# Patient Record
Sex: Female | Born: 1945
Health system: Southern US, Community
[De-identification: ages and names within clinical notes are randomized; demographics above are authoritative.]

## PROBLEM LIST (undated history)

## (undated) DIAGNOSIS — Z780 Asymptomatic menopausal state: Secondary | ICD-10-CM

## (undated) DIAGNOSIS — M8580 Other specified disorders of bone density and structure, unspecified site: Secondary | ICD-10-CM

## (undated) DIAGNOSIS — M19049 Primary osteoarthritis, unspecified hand: Secondary | ICD-10-CM

## (undated) HISTORY — PX: BREAST EXCISIONAL BIOPSY: SUR124

## (undated) HISTORY — DX: Primary osteoarthritis, unspecified hand: M19.049

## (undated) HISTORY — DX: Asymptomatic menopausal state: Z78.0

## (undated) HISTORY — PX: CATARACT EXTRACTION, BILATERAL: SHX1313

## (undated) HISTORY — DX: Other specified disorders of bone density and structure, unspecified site: M85.80

## (undated) HISTORY — PX: BREAST SURGERY: SHX581

## (undated) HISTORY — PX: TONSILLECTOMY: SUR1361

## (undated) HISTORY — PX: OTHER SURGICAL HISTORY: SHX169

---

## 1972-10-24 HISTORY — PX: BREAST EXCISIONAL BIOPSY: SUR124

## 1999-03-16 ENCOUNTER — Encounter: Payer: Self-pay | Admitting: Obstetrics & Gynecology

## 1999-03-16 ENCOUNTER — Ambulatory Visit (HOSPITAL_COMMUNITY): Admission: RE | Admit: 1999-03-16 | Discharge: 1999-03-16 | Payer: Self-pay | Admitting: Obstetrics & Gynecology

## 1999-04-20 ENCOUNTER — Other Ambulatory Visit: Admission: RE | Admit: 1999-04-20 | Discharge: 1999-04-20 | Payer: Self-pay | Admitting: Obstetrics and Gynecology

## 1999-04-21 ENCOUNTER — Encounter (INDEPENDENT_AMBULATORY_CARE_PROVIDER_SITE_OTHER): Payer: Self-pay | Admitting: Specialist

## 1999-04-21 ENCOUNTER — Other Ambulatory Visit: Admission: RE | Admit: 1999-04-21 | Discharge: 1999-04-21 | Payer: Self-pay | Admitting: Obstetrics and Gynecology

## 2000-04-10 ENCOUNTER — Encounter: Payer: Self-pay | Admitting: Obstetrics and Gynecology

## 2000-04-10 ENCOUNTER — Ambulatory Visit (HOSPITAL_COMMUNITY): Admission: RE | Admit: 2000-04-10 | Discharge: 2000-04-10 | Payer: Self-pay | Admitting: Obstetrics and Gynecology

## 2000-06-08 ENCOUNTER — Other Ambulatory Visit: Admission: RE | Admit: 2000-06-08 | Discharge: 2000-06-08 | Payer: Self-pay | Admitting: *Deleted

## 2001-04-17 ENCOUNTER — Ambulatory Visit (HOSPITAL_COMMUNITY): Admission: RE | Admit: 2001-04-17 | Discharge: 2001-04-17 | Payer: Self-pay | Admitting: Obstetrics and Gynecology

## 2001-04-17 ENCOUNTER — Encounter: Payer: Self-pay | Admitting: Obstetrics and Gynecology

## 2001-05-15 ENCOUNTER — Other Ambulatory Visit: Admission: RE | Admit: 2001-05-15 | Discharge: 2001-05-15 | Payer: Self-pay | Admitting: Obstetrics and Gynecology

## 2002-08-05 ENCOUNTER — Other Ambulatory Visit: Admission: RE | Admit: 2002-08-05 | Discharge: 2002-08-05 | Payer: Self-pay | Admitting: Obstetrics and Gynecology

## 2002-08-20 ENCOUNTER — Ambulatory Visit (HOSPITAL_COMMUNITY): Admission: RE | Admit: 2002-08-20 | Discharge: 2002-08-20 | Payer: Self-pay | Admitting: Obstetrics and Gynecology

## 2002-08-20 ENCOUNTER — Encounter: Payer: Self-pay | Admitting: Obstetrics and Gynecology

## 2003-09-02 ENCOUNTER — Encounter: Admission: RE | Admit: 2003-09-02 | Discharge: 2003-09-02 | Payer: Self-pay | Admitting: Obstetrics and Gynecology

## 2003-09-09 ENCOUNTER — Ambulatory Visit (HOSPITAL_COMMUNITY): Admission: RE | Admit: 2003-09-09 | Discharge: 2003-09-09 | Payer: Self-pay | Admitting: Obstetrics & Gynecology

## 2005-12-06 ENCOUNTER — Ambulatory Visit (HOSPITAL_COMMUNITY): Admission: RE | Admit: 2005-12-06 | Discharge: 2005-12-06 | Payer: Self-pay | Admitting: Obstetrics and Gynecology

## 2007-09-19 ENCOUNTER — Ambulatory Visit (HOSPITAL_COMMUNITY): Admission: RE | Admit: 2007-09-19 | Discharge: 2007-09-19 | Payer: Self-pay | Admitting: Family Medicine

## 2008-09-19 ENCOUNTER — Encounter: Admission: RE | Admit: 2008-09-19 | Discharge: 2008-09-19 | Payer: Self-pay | Admitting: Family Medicine

## 2009-09-29 ENCOUNTER — Encounter: Admission: RE | Admit: 2009-09-29 | Discharge: 2009-09-29 | Payer: Self-pay | Admitting: Family Medicine

## 2010-02-17 ENCOUNTER — Other Ambulatory Visit: Admission: RE | Admit: 2010-02-17 | Discharge: 2010-02-17 | Payer: Self-pay | Admitting: Family Medicine

## 2010-02-17 LAB — HM PAP SMEAR: HM Pap smear: NORMAL

## 2010-04-15 LAB — HM COLONOSCOPY

## 2010-10-26 ENCOUNTER — Encounter
Admission: RE | Admit: 2010-10-26 | Discharge: 2010-10-26 | Payer: Self-pay | Source: Home / Self Care | Attending: Family Medicine | Admitting: Family Medicine

## 2010-10-26 LAB — HM MAMMOGRAPHY: HM Mammogram: ABNORMAL

## 2010-11-02 ENCOUNTER — Encounter
Admission: RE | Admit: 2010-11-02 | Discharge: 2010-11-02 | Payer: Self-pay | Source: Home / Self Care | Attending: Family Medicine | Admitting: Family Medicine

## 2010-11-14 ENCOUNTER — Encounter: Payer: Self-pay | Admitting: Family Medicine

## 2011-03-11 NOTE — Group Therapy Note (Signed)
   NAME:  Sheryl Baker, Sheryl Baker NO.:  [id number]   MEDICAL RECORD NO.:  [id number]                   PATIENT TYPE:  OUT   LOCATION:  [facility]                           FACILITY:  [facility]   PHYSICIAN:  Elsie Lincoln, MD                   DATE OF BIRTH:  [birth year]   DATE OF SERVICE:  09/02/2003                                    CLINIC NOTE   CHIEF COMPLAINT:  The patient is a 65 year old G1 para 1-0-0-1, LMP 10 years  ago, female, who presents for a wellness visit.  The patient has no  complaints of vaginal bleeding, vaginal discharge, vaginal burning, pelvic  pain, or urinary symptoms.  The patient was recently getting care at  [facility] and we have her records.  She has a normal Pap smear and  normal mammogram done October 2003.   REVIEW OF SYMPTOMS:  No chest pain, shortness of breath, abdominal pain,  nausea, vomiting, diarrhea, or change in bladder habits.   PAST MEDICAL HISTORY:  Denies.   PAST SURGICAL HISTORY:  Left breast lump removed that was benign.   PAST GYNECOLOGICAL HISTORY:  No fibroids, no cysts, no sexually transmitted  diseases, no abnormal Pap smears.  The patient had one full term normal  vaginal delivery that she placed for adoption.   PHYSICAL EXAMINATION:  VITAL SIGNS:  Blood pressure 145/70, pulse 94.  GENERAL:  Well-nourished, well-developed, no apparent distress.  BREASTS:  No skin changes, no masses.  ABDOMEN:  Soft, nontender, nondistended.  PELVIC:  External genitalia:  Tanner V.  Vagina:  Atrophic, no discharge, no  blood.  Cervix:  Grossly normal, closed.  Uterus:  Small, nontender.  Adnexa:  No masses, nontender.  Rectovaginal:  No masses, guaiac done.   ASSESSMENT AND PLAN:  A 65 year old G1 para 1-0-0-1 with normal GYN exam.   1. Pap smear done today.  2. DEXA and mammogram ordered.  3. Increase calcium intake to 1500 mg a day with vitamin D.  The patient is     to continue weight-bearing exercises to help  improve bone health.  4. Return to clinic in one year or sooner if needed.                                               Elsie Lincoln, MD    KL/MEDQ  D:  09/02/2003  T:  09/02/2003  Job:  [id number]

## 2011-03-24 ENCOUNTER — Encounter: Payer: Self-pay | Admitting: *Deleted

## 2011-06-22 ENCOUNTER — Ambulatory Visit (INDEPENDENT_AMBULATORY_CARE_PROVIDER_SITE_OTHER): Payer: Medicare Other | Admitting: Family Medicine

## 2011-06-22 ENCOUNTER — Encounter: Payer: Self-pay | Admitting: Family Medicine

## 2011-06-22 VITALS — BP 138/70 | HR 64 | Ht 70.0 in | Wt 145.0 lb

## 2011-06-22 DIAGNOSIS — Z23 Encounter for immunization: Secondary | ICD-10-CM

## 2011-06-22 DIAGNOSIS — M8580 Other specified disorders of bone density and structure, unspecified site: Secondary | ICD-10-CM | POA: Insufficient documentation

## 2011-06-22 DIAGNOSIS — M899 Disorder of bone, unspecified: Secondary | ICD-10-CM

## 2011-06-22 DIAGNOSIS — Z131 Encounter for screening for diabetes mellitus: Secondary | ICD-10-CM

## 2011-06-22 DIAGNOSIS — Z13220 Encounter for screening for lipoid disorders: Secondary | ICD-10-CM

## 2011-06-22 DIAGNOSIS — R5383 Other fatigue: Secondary | ICD-10-CM

## 2011-06-22 DIAGNOSIS — Z0000 Encounter for general adult medical examination without abnormal findings: Secondary | ICD-10-CM

## 2011-06-22 DIAGNOSIS — R5381 Other malaise: Secondary | ICD-10-CM

## 2011-06-22 LAB — LIPID PANEL
Cholesterol: 210 mg/dL — ABNORMAL HIGH (ref 0–200)
HDL: 78 mg/dL (ref >39)
LDL Cholesterol: 120 mg/dL — ABNORMAL HIGH (ref 0–99)
Triglycerides: 62 mg/dL (ref <150)

## 2011-06-22 LAB — POCT URINALYSIS DIPSTICK
Bilirubin, UA: NEGATIVE
Glucose, UA: NEGATIVE
Nitrite, UA: NEGATIVE
Urobilinogen, UA: NEGATIVE

## 2011-06-22 LAB — VITAMIN D 25 HYDROXY (VIT D DEFICIENCY, FRACTURES): Vit D, 25-Hydroxy: 52 ng/mL (ref 30–89)

## 2011-06-22 NOTE — Patient Instructions (Addendum)
Consider either stopping aspirin or lowering dose to 81mg --you have to consider the potential risks of aspirin (ulcer, gastritis), which may outweight the potential benefit, especially given that you don't have significant risk factors for heart disease or stroke  HEALTH MAINTENANCE RECOMMENDATIONS:  It is recommended that you get at least 30 minutes of aerobic exercise at least 5 days/week (for weight loss, you may need as much as 60-90 minutes). This can be any activity that gets your heart rate up. This can be divided in 10-15 minute intervals if needed, but try and build up your endurance at least once a week.  Weight bearing exercise is also recommended twice weekly.  Eat a healthy diet with lots of vegetables, fruits and fiber.  "Colorful" foods have a lot of vitamins (ie green vegetables, tomatoes, red peppers, etc).  Limit sweet tea, regular sodas and alcoholic beverages, all of which has a lot of calories and sugar.  Up to 1 alcoholic drink daily may be beneficial for women (unless trying to lose weight, watch sugars).  Drink a lot of water.  Calcium recommendations are 1200-1500 mg daily (1500 mg for postmenopausal women or women without ovaries), and vitamin D 1000 IU daily.  This should be obtained from diet and/or supplements (vitamins), and calcium should not be taken all at once, but in divided doses.  Monthly self breast exams and yearly mammograms for women over the age of 71 is recommended.  Sunscreen of at least SPF 30 should be used on all sun-exposed parts of the skin when outside between the hours of 10 am and 4 pm (not just when at beach or pool, but even with exercise, golf, tennis, and yard work!)  Use a sunscreen that says "broad spectrum" so it covers both UVA and UVB rays, and make sure to reapply every 1-2 hours.  Remember to change the batteries in your smoke detectors when changing your clock times in the spring and fall.  Use your seat belt every time you are in a car,  and please drive safely and not be distracted with cell phones and texting while driving.  Consider Shingles vaccine (Zostavax)--recommended, but without Medicare Part D, I don't think your insurance covers it Periodically check your blood pressure elsewhere.  If blood pressure persistently >140 systolic, then please follow up here to discuss.  Watch the sodium in your diet (limit to <2000 mg daily)

## 2011-06-22 NOTE — Progress Notes (Signed)
Sheryl Baker is a 65 y.o. female who presents for a complete physical.  She has the following concerns: None  Immunization History  Administered Date(s) Administered  . Influenza Split 07/01/2009  . Influenza Whole 07/28/2010, 06/22/2011  . Pneumococcal Polysaccharide 02/17/2010  . Td 07/25/2005   Last Pap smear:01/2010, no h/o abnormals Last mammogram: 09/2010 Last colonoscopy: 03/2010 (polyps biopsied, not actually polyps, no adenomatous change Last DEXA: 02/2010 (mild osteopenia; T-1.3 at L fem neck)--done at [facility] Dentist: every 6 months Ophtho: goes yearly Exercise: yardwork, walks 2-3 times/week for 30-60 minutes  Past Medical History  Diagnosis Date  . Postmenopausal     Past Surgical History  Procedure Date  . Excision of bcc lower eyelid Dr.Christensen 02/2009  . Right throat nodule removed(benign)Dr.Mundy 2001  . Breast surgery left breast lump removed(benign)1974    History   Social History  . Marital Status: Married    Spouse Name: N/A    Number of Children: N/A  . Years of Education: N/A   Occupational History  . 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 Smoking status: Never Smoker   . Smokeless tobacco: Never Used  . Alcohol Use: Yes     1 glass of beer or wine 2 to 3 times a week.  . Drug Use: No  . Sexually Active: Not on file   Other Topics Concern  . Not on file   Social History Narrative   Lives with her husband; she is his caretaker since he has had 2 strokes    Family History  Problem Relation Age of Onset  . Dementia Mother   . Heart disease Father     valve replacement and bypass surgery in his 77's  . Cancer Father     renal cell carcinoma  . Alcohol abuse Sister   . Diabetes Sister   . Seizures Sister   . Pancreatitis Sister     Current outpatient prescriptions:aspirin 325 MG tablet, Take 325 mg by mouth daily.  , Disp: , Rfl: ;  Calcium Carbonate-Vitamin D (CALCIUM  600+D) 600-400 MG-UNIT per tablet, Take 1 tablet by mouth daily.  , Disp: , Rfl: ;  Glucosamine-Chondroit-Vit C-Mn (GLUCOSAMINE CHONDR 1500 COMPLX PO), Take 1 tablet by mouth daily.  , Disp: , Rfl: ;  Multiple Vitamins-Minerals (MULTIVITAMIN WITH MINERALS) tablet, Take 1 tablet by mouth daily.  , Disp: , Rfl:  Omega-3 Fatty Acids (FISH OIL) 1000 MG CAPS, Take 1 capsule by mouth daily.  , Disp: , Rfl: ;  vitamin C (ASCORBIC ACID) 500 MG tablet, Take 500 mg by mouth daily.  , Disp: , Rfl:   Allergies  Allergen Reactions  . Codeine Nausea And Vomiting   ROS:The patient denies anorexia, fever, weight changes, headaches,  vision changes, decreased hearing, ear pain, sore throat, breast concerns, chest pain, palpitations, dizziness, syncope, dyspnea on exertion, cough, swelling, nausea, vomiting, diarrhea, constipation, abdominal pain, melena, hematochezia, indigestion/heartburn, hematuria, incontinence, dysuria, irregular menstrual cycles, vaginal discharge, odor or itch, genital lesions, joint pains, numbness, tingling, weakness, tremor, suspicious skin lesions, depression, anxiety, abnormal bleeding/bruising, or enlarged lymph nodes.  PHYSICAL EXAM: BP 140/70  Pulse 64  Ht 5\' 10"  (1.778 m)  Wt 145 lb (65.772 kg)  BMI 20.81 kg/m2  General Appearance:    Alert, cooperative, no distress, appears stated age  Head:    Normocephalic, without obvious abnormality, atraumatic  Eyes:    PERRL, conjunctiva/corneas clear, EOM's intact, fundi  benign  Ears:    Normal TM's and external ear canals  Nose:   Nares normal, mucosa normal, no drainage or sinus   tenderness  Throat:   Lips, mucosa, and tongue normal; teeth and gums normal  Neck:   Supple, no lymphadenopathy;  thyroid:  no   enlargement/tenderness/nodules; no carotid   bruit or JVD  Back:    Spine nontender, no curvature, ROM normal, no CVA     tenderness  Lungs:     Clear to auscultation bilaterally without wheezes, rales or     ronchi;  respirations unlabored  Chest Wall:    No tenderness or deformity   Heart:    Regular rate and rhythm, S1 and S2 normal, no murmur, rub   or gallop  Breast Exam:    No tenderness, masses, or nipple discharge or inversion.      No axillary lymphadenopathy  Abdomen:     Soft, non-tender, nondistended, normoactive bowel sounds,    no masses, no hepatosplenomegaly  Genitalia:    Normal external genitalia without lesions.  BUS and vagina normal; no cervical motion tenderness. No abnormal vaginal discharge.  Uterus and adnexa not enlarged, nontender, no masses.  Pap not performed; only bimanual exam  Rectal:    Normal tone, no masses or tenderness; guaiac negative stool  Extremities:   No clubbing, cyanosis or edema  Pulses:   2+ and symmetric all extremities  Skin:   Skin color, texture, turgor normal, no rashes or lesions  Lymph nodes:   Cervical, supraclavicular, and axillary nodes normal  Neurologic:   CNII-XII intact, normal strength, sensation and gait; reflexes 2+ and symmetric throughout          Psych:   Normal mood, affect, hygiene and grooming.    ASSESSMENT/PLAN: 1. Routine general medical examination at a health care facility  Flu vaccine greater than or equal to 3yo preservative free IM, Visual acuity screening, POCT Urinalysis Dipstick  2. Need for Tdap vaccination  Tdap vaccine greater than or equal to 7yo IM  3. Osteopenia  Vitamin D 25 hydroxy  4. Screening for lipoid disorders  Lipid panel  5. Screening for diabetes mellitus  Glucose, random  6. Other malaise and fatigue  Vitamin D 25 hydroxy, TSH, Glucose, random   Discussed borderline BP--she has BP monitor at home, advised to periodically check BP, limit sodium intake.  F/u if persistently >135-140 systolic  Mild osteopenia 02/2010.  Discussed calcium, vitamin D, weight bearing exercise.  Doesn't necessarily need to repeat 02/2012, but within the next 2-3 years from now.  Discussed monthly self breast exams and yearly  mammograms after the age of 73; at least 30 minutes of aerobic activity at least 5 days/week; proper sunscreen use reviewed; healthy diet, including goals of calcium and vitamin D intake and alcohol recommendations (less than or equal to 1 drink/day) reviewed; regular seatbelt use; changing batteries in smoke detectors.  Immunization recommendations discussed--TdaP and flu shot given today. Recommended Zostavax--waiting due to cost.  Colonoscopy recommendations reviewed--UTD

## 2011-06-23 ENCOUNTER — Encounter: Payer: Self-pay | Admitting: Family Medicine

## 2011-08-01 ENCOUNTER — Telehealth: Payer: Self-pay | Admitting: *Deleted

## 2011-08-01 NOTE — Telephone Encounter (Signed)
Patient called and said she would like 2 rx's for Zostavax written for her and Al please. I will mail them to her when ready. Thanks.

## 2011-08-01 NOTE — Telephone Encounter (Signed)
Confirm that they've never received vaccine.  Rx's written

## 2011-08-01 NOTE — Telephone Encounter (Signed)
Spoke with patient, she and her husband have not ever received vaccine, mailed rx's to them. Rec they use [facility] as they are very good at sending documentation to Dr.Knapp, but if they chose to go elsewhere please ask pharmacy to send [place] info. Thanks.

## 2012-05-24 ENCOUNTER — Other Ambulatory Visit: Payer: Self-pay | Admitting: Family Medicine

## 2012-05-24 DIAGNOSIS — Z1231 Encounter for screening mammogram for malignant neoplasm of breast: Secondary | ICD-10-CM

## 2012-05-29 ENCOUNTER — Ambulatory Visit
Admission: RE | Admit: 2012-05-29 | Discharge: 2012-05-29 | Disposition: A | Discharge status: Home / Self Care | Payer: Medicare Other | Source: Ambulatory Visit | Attending: Family Medicine | Admitting: Family Medicine

## 2012-05-29 DIAGNOSIS — Z1231 Encounter for screening mammogram for malignant neoplasm of breast: Secondary | ICD-10-CM

## 2012-05-31 ENCOUNTER — Other Ambulatory Visit: Payer: Self-pay | Admitting: Family Medicine

## 2012-05-31 DIAGNOSIS — R928 Other abnormal and inconclusive findings on diagnostic imaging of breast: Secondary | ICD-10-CM

## 2012-06-01 ENCOUNTER — Ambulatory Visit
Admission: RE | Admit: 2012-06-01 | Discharge: 2012-06-01 | Disposition: A | Discharge status: Home / Self Care | Payer: Medicare Other | Source: Ambulatory Visit | Attending: Family Medicine | Admitting: Family Medicine

## 2012-06-01 DIAGNOSIS — R928 Other abnormal and inconclusive findings on diagnostic imaging of breast: Secondary | ICD-10-CM

## 2012-08-06 ENCOUNTER — Encounter: Payer: Self-pay | Admitting: Internal Medicine

## 2012-08-09 ENCOUNTER — Telehealth: Payer: Self-pay | Admitting: *Deleted

## 2012-08-09 NOTE — Telephone Encounter (Signed)
Pt notified and appt canceled

## 2012-08-09 NOTE — Telephone Encounter (Signed)
Coming in for labs for CPE tomorrow, need to know what to order. Thanks.

## 2012-08-09 NOTE — Telephone Encounter (Signed)
It doesn't appear that she needs any routine labs (all normal last year, not on meds, not overweight).  If she is having symptoms or concerns, we can discuss at her CPE and order then.

## 2012-08-10 ENCOUNTER — Other Ambulatory Visit: Payer: Medicare Other

## 2012-08-13 ENCOUNTER — Encounter: Payer: Self-pay | Admitting: Family Medicine

## 2012-08-13 ENCOUNTER — Ambulatory Visit (INDEPENDENT_AMBULATORY_CARE_PROVIDER_SITE_OTHER): Payer: Medicare Other | Admitting: Family Medicine

## 2012-08-13 VITALS — BP 160/90 | HR 72 | Ht 69.75 in | Wt 152.0 lb

## 2012-08-13 DIAGNOSIS — E7800 Pure hypercholesterolemia, unspecified: Secondary | ICD-10-CM

## 2012-08-13 DIAGNOSIS — Z0000 Encounter for general adult medical examination without abnormal findings: Secondary | ICD-10-CM

## 2012-08-13 LAB — POCT URINALYSIS DIPSTICK
Blood, UA: NEGATIVE
Glucose, UA: NEGATIVE
Leukocytes, UA: NEGATIVE
Nitrite, UA: NEGATIVE
Protein, UA: NEGATIVE
Urobilinogen, UA: NEGATIVE
pH, UA: 5

## 2012-08-13 LAB — LIPID PANEL
Cholesterol: 224 mg/dL — ABNORMAL HIGH (ref 0–200)
HDL: 63 mg/dL (ref >39)
VLDL: 16 mg/dL (ref 0–40)

## 2012-08-13 NOTE — Progress Notes (Signed)
Chief Complaint  Patient presents with  . Annual Exam    non fasting annual exam/ AWV, last pap 02/17/2010.    Donated blood last week, BP was 138/68.  Housecalls nurse (through insurance) checked BP within the last month or two (early September?) and was 148/88.  Has machine at home, but hasn't checked her BP since then.  Denies any headaches, chest pain.  She brings in a form to be filled out in order to get Reward for having AWV done.  This requires lipids to be done.  Last ate approximately 6 hours ago.  Chart reviewed, and no other labs needed.  Health Maintenance: Immunization History  Administered Date(s) Administered  . Influenza Split 07/01/2009, 07/04/2012  . Influenza Whole 07/28/2010, 06/22/2011  . Pneumococcal Polysaccharide 02/17/2010  . Td 07/25/2005  . Tdap 06/22/2011  . Zoster 08/26/2011   Last Pap smear: 01/2010, no h/o abnormals Last mammogram: 05/2012 Last colonoscopy: 03/2010 Last DEXA: DEXA 02/2010 (mild osteopenia: T-1.3 at L fem neck), done at [facility] Dentist: every 6 months Ophtho: yearly Exercise: yardwork daily, walks 2-3 times/week for 60 minutes.  Annual Wellness Visit: Other doctors involved in patient's care:  Ophtho: Dr. Charlotte Sanes: Dentist: Dr. Sharma Covert Depression and ADL screen--see questionnaire (scanned) both normal. End of Life Care: doesn't have healthcare power of attorney or living will (though she is familiar with them, her husband has them).  Past Medical History  Diagnosis Date  . Postmenopausal     Past Surgical History  Procedure Date  . Excision of basal cell cancer lower eyelid Dr.Christensen 02/2009  . Right throat nodule removed(benign)Dr.Mundy 2001  . Breast surgery left breast lump removed(benign)1974    History   Social History  . Marital Status: Married    Spouse Name: N/A    Number of Children: N/A  . Years of Education: N/A   Occupational History  . 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 Smoking status: Never Smoker   . Smokeless tobacco: Never Used  . Alcohol Use: Yes     1 glass of beer or wine 2 times a week.  . Drug Use: No  . Sexually Active: Not Currently   Other Topics Concern  . 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 2 cats.  Stepdaughter and son-in-law leave nearby    Family History  Problem Relation Age of Onset  . Dementia Mother   . Heart disease Father     valve replacement and bypass surgery in his 52's  . Cancer Father     renal cell carcinoma  . Alcohol abuse Sister   . Diabetes Sister   . Seizures Sister   . Pancreatitis Sister     Current outpatient prescriptions:Calcium Carbonate-Vitamin D (CALCIUM 600+D) 600-400 MG-UNIT per tablet, Take 1 tablet by mouth daily.  , Disp: , Rfl: ;  Multiple Vitamins-Minerals (MULTIVITAMIN WITH MINERALS) tablet, Take 1 tablet by mouth daily.  , Disp: , Rfl: ;  Omega-3 Fatty Acids (FISH OIL) 1000 MG CAPS, Take 1 capsule by mouth daily.  , Disp: , Rfl: ;  vitamin C (ASCORBIC ACID) 500 MG tablet, Take 500 mg by mouth daily.  , Disp: , Rfl:  aspirin 325 MG tablet, Take 325 mg by mouth daily.  , Disp: , Rfl: ;  Glucosamine-Chondroit-Vit C-Mn (GLUCOSAMINE CHONDR 1500 COMPLX PO), Take 1 tablet by mouth  daily.  , Disp: , Rfl:   Allergies  Allergen Reactions  . Codeine Nausea And Vomiting   ROS:  The patient denies anorexia, fever, weight changes, headaches,  vision changes, decreased hearing, ear pain, sore throat, breast concerns, chest pain, palpitations, dizziness, syncope, dyspnea on exertion, cough, swelling, nausea, vomiting, diarrhea, constipation, abdominal pain, melena, hematochezia, indigestion/heartburn, hematuria, incontinence, dysuria, vaginal bleeding/spotting, discharge, odor or itch, genital lesions, joint pains, numbness, tingling, weakness, tremor, suspicious skin lesions, depression, anxiety, abnormal  bleeding/bruising, or enlarged lymph nodes.  PHYSICAL EXAM: BP 160/90  Pulse 72  Ht 5' 9.75" (1.772 m)  Wt 152 lb (68.947 kg)  BMI 21.97 kg/m2 Repeat BP 152/82 General Appearance:    Alert, cooperative, no distress, appears stated age  Head:    Normocephalic, without obvious abnormality, atraumatic  Eyes:    PERRL, conjunctiva/corneas clear, EOM's intact, fundi    benign  Ears:    Normal TM's and external ear canals  Nose:   Nares normal, mucosa normal, no drainage or sinus   tenderness  Throat:   Lips, mucosa, and tongue normal; teeth and gums normal  Neck:   Supple, no lymphadenopathy;  thyroid:  no   enlargement/tenderness/nodules; no carotid   bruit or JVD  Back:    Spine nontender, no curvature, ROM normal, no CVA     tenderness  Lungs:     Clear to auscultation bilaterally without wheezes, rales or     ronchi; respirations unlabored  Chest Wall:    No tenderness or deformity   Heart:    Regular rate and rhythm, S1 and S2 normal, no murmur, rub   or gallop  Breast Exam:    No tenderness, masses, or nipple discharge or inversion.      No axillary lymphadenopathy  Abdomen:     Soft, non-tender, nondistended, normoactive bowel sounds,    no masses, no hepatosplenomegaly  Genitalia:    Normal external genitalia without lesions.  BUS and vagina normal; bimanual exam normal--uterus and adnexa not enlarged, nontender, no masses.  Pap not performed  Rectal:    Normal tone, no masses or tenderness; guaiac negative stool  Extremities:   No clubbing, cyanosis or edema  Pulses:   2+ and symmetric all extremities  Skin:   Skin color, texture, turgor normal, no rashes or lesions  Lymph nodes:   Cervical, supraclavicular, and axillary nodes normal  Neurologic:   CNII-XII intact, normal strength, sensation and gait; reflexes 2+ and symmetric throughout          Psych:   Normal mood, affect, hygiene and grooming.    Normal urine dip.  ASSESSMENT/PLAN:  1. Routine general medical  examination at a health care facility    2. Medicare annual wellness visit, subsequent  POCT Urinalysis Dipstick, Visual acuity screening  3. Pure hypercholesterolemia  Lipid panel   Needs lipids done for patient to get AWV Reward (required for form).  Mild osteopenia 02/2010. Discussed calcium, vitamin D, weight bearing exercise. Didn't necessarily need to repeat in 02/2012, but within the next 1-3 years from now. Reviewed treatment with calcium, Vitamin D and weight-bearing exercise.  Discussed monthly self breast exams and yearly mammograms; at least 30 minutes of aerobic activity at least 5 days/week; proper sunscreen use reviewed; healthy diet, including goals of calcium and vitamin D intake and alcohol recommendations (less than or equal to 1 drink/day) reviewed; regular seatbelt use; changing batteries in smoke detectors.  Immunization recommendations discussed, UTD.  Colonoscopy recommendations reviewed, UTD  Reviewed  end of life issues--discussed and filled out MOST form.  Given information and encouraged her to get healthcare power of attorney and living will.

## 2012-08-13 NOTE — Patient Instructions (Signed)

## 2013-08-29 ENCOUNTER — Other Ambulatory Visit: Payer: Self-pay

## 2014-04-07 ENCOUNTER — Telehealth: Payer: Self-pay | Admitting: *Deleted

## 2014-04-07 NOTE — Telephone Encounter (Signed)
Spoke with patient and gave her 2 numbers one for Dr.Chan Melford Aase [phone] and Dr.Michelle Zigmund Daniel [phone] both taking new medicare patients.

## 2014-04-07 NOTE — Telephone Encounter (Signed)
Gwinda Passe called to inquire for a friend. 68 year old that lives at [place] that no longer desires to see Dr.Greene to monitor her coumadin. She Medicare and wanted to see Dr.Knapp, or Dr.Lalonde. I know that neither of you are taking new patient-any recommedations?

## 2014-04-07 NOTE — Telephone Encounter (Signed)
None of [place] at this practice are taking new Medi-Medi pts.  Front desk can tell her who is.  Depending on reason for her being on coumadin, sometimes a specialist can do (ie coumadin clinic through cardiologist office, etc)

## 2014-07-18 ENCOUNTER — Encounter: Payer: Self-pay | Admitting: Internal Medicine

## 2015-04-15 ENCOUNTER — Other Ambulatory Visit (HOSPITAL_COMMUNITY)
Admission: RE | Admit: 2015-04-15 | Discharge: 2015-04-15 | Disposition: A | Discharge status: Home / Self Care | Payer: Medicare Other | Source: Ambulatory Visit | Attending: Family Medicine | Admitting: Family Medicine

## 2015-04-15 ENCOUNTER — Encounter: Payer: Self-pay | Admitting: Family Medicine

## 2015-04-15 ENCOUNTER — Ambulatory Visit (INDEPENDENT_AMBULATORY_CARE_PROVIDER_SITE_OTHER): Payer: Medicare Other | Admitting: Family Medicine

## 2015-04-15 VITALS — BP 130/80 | HR 84 | Ht 70.0 in | Wt 149.4 lb

## 2015-04-15 DIAGNOSIS — Z23 Encounter for immunization: Secondary | ICD-10-CM

## 2015-04-15 DIAGNOSIS — Z0000 Encounter for general adult medical examination without abnormal findings: Secondary | ICD-10-CM | POA: Diagnosis not present

## 2015-04-15 DIAGNOSIS — R5383 Other fatigue: Secondary | ICD-10-CM | POA: Diagnosis not present

## 2015-04-15 DIAGNOSIS — Z01419 Encounter for gynecological examination (general) (routine) without abnormal findings: Secondary | ICD-10-CM

## 2015-04-15 DIAGNOSIS — E7800 Pure hypercholesterolemia, unspecified: Secondary | ICD-10-CM

## 2015-04-15 DIAGNOSIS — Z124 Encounter for screening for malignant neoplasm of cervix: Secondary | ICD-10-CM | POA: Diagnosis not present

## 2015-04-15 DIAGNOSIS — M8580 Other specified disorders of bone density and structure, unspecified site: Secondary | ICD-10-CM | POA: Diagnosis not present

## 2015-04-15 DIAGNOSIS — Z1151 Encounter for screening for human papillomavirus (HPV): Secondary | ICD-10-CM | POA: Diagnosis not present

## 2015-04-15 LAB — COMPREHENSIVE METABOLIC PANEL
ALBUMIN: 4.2 g/dL (ref 3.5–5.2)
ALK PHOS: 75 U/L (ref 39–117)
ALT: 12 U/L (ref 0–35)
AST: 19 U/L (ref 0–37)
BUN: 12 mg/dL (ref 6–23)
CO2: 25 mEq/L (ref 19–32)
Calcium: 9.5 mg/dL (ref 8.4–10.5)
Chloride: 106 mEq/L (ref 96–112)
Creat: 0.61 mg/dL (ref 0.50–1.10)
Glucose, Bld: 99 mg/dL (ref 70–99)
Potassium: 4.5 mEq/L (ref 3.5–5.3)
Sodium: 140 mEq/L (ref 135–145)
Total Bilirubin: 0.6 mg/dL (ref 0.2–1.2)
Total Protein: 6.6 g/dL (ref 6.0–8.3)

## 2015-04-15 LAB — CBC WITH DIFFERENTIAL/PLATELET
BASOS ABS: 0 10*3/uL (ref 0.0–0.1)
BASOS PCT: 0 % (ref 0–1)
Eosinophils Absolute: 0 10*3/uL (ref 0.0–0.7)
Eosinophils Relative: 0 % (ref 0–5)
HCT: 36.2 % (ref 36.0–46.0)
Hemoglobin: 11.9 g/dL — ABNORMAL LOW (ref 12.0–15.0)
LYMPHS ABS: 2 10*3/uL (ref 0.7–4.0)
LYMPHS PCT: 38 % (ref 12–46)
MCH: 29.4 pg (ref 26.0–34.0)
MCHC: 32.9 g/dL (ref 30.0–36.0)
MCV: 89.4 fL (ref 78.0–100.0)
MPV: 10.5 fL (ref 8.6–12.4)
Monocytes Absolute: 0.4 10*3/uL (ref 0.1–1.0)
Monocytes Relative: 8 % (ref 3–12)
NEUTROS PCT: 54 % (ref 43–77)
Neutro Abs: 2.9 10*3/uL (ref 1.7–7.7)
PLATELETS: 251 10*3/uL (ref 150–400)
RBC: 4.05 MIL/uL (ref 3.87–5.11)
RDW: 15.3 % (ref 11.5–15.5)
WBC: 5.3 10*3/uL (ref 4.0–10.5)

## 2015-04-15 LAB — LIPID PANEL
CHOL/HDL RATIO: 2.7 ratio
Cholesterol: 188 mg/dL (ref 0–200)
HDL: 69 mg/dL (ref >=46)
LDL Cholesterol: 102 mg/dL — ABNORMAL HIGH (ref 0–99)
Triglycerides: 83 mg/dL (ref <150)
VLDL: 17 mg/dL (ref 0–40)

## 2015-04-15 LAB — TSH: TSH: 2.431 u[IU]/mL (ref 0.350–4.500)

## 2015-04-15 LAB — POCT URINALYSIS DIPSTICK
Bilirubin, UA: NEGATIVE
GLUCOSE UA: NEGATIVE
Ketones, UA: NEGATIVE
Leukocytes, UA: NEGATIVE
NITRITE UA: NEGATIVE
Protein, UA: NEGATIVE
RBC UA: NEGATIVE
Spec Grav, UA: >=1.03
UROBILINOGEN UA: NEGATIVE
pH, UA: 6

## 2015-04-15 NOTE — Patient Instructions (Addendum)
  HEALTH MAINTENANCE RECOMMENDATIONS:  It is recommended that you get at least 30 minutes of aerobic exercise at least 5 days/week (for weight loss, you may need as much as 60-90 minutes). This can be any activity that gets your heart rate up. This can be divided in 10-15 minute intervals if needed, but try and build up your endurance at least once a week.  Weight bearing exercise is also recommended twice weekly.  Eat a healthy diet with lots of vegetables, fruits and fiber.  "Colorful" foods have a lot of vitamins (ie green vegetables, tomatoes, red peppers, etc).  Limit sweet tea, regular sodas and alcoholic beverages, all of which has a lot of calories and sugar.  Up to 1 alcoholic drink daily may be beneficial for women (unless trying to lose weight, watch sugars).  Drink a lot of water.  Calcium recommendations are 1200-1500 mg daily (1500 mg for postmenopausal women or women without ovaries), and vitamin D 1000 IU daily.  This should be obtained from diet and/or supplements (vitamins), and calcium should not be taken all at once, but in divided doses.  Monthly self breast exams and yearly mammograms for women over the age of 19 is recommended.  Sunscreen of at least SPF 30 should be used on all sun-exposed parts of the skin when outside between the hours of 10 am and 4 pm (not just when at beach or pool, but even with exercise, golf, tennis, and yard work!)  Use a sunscreen that says "broad spectrum" so it covers both UVA and UVB rays, and make sure to reapply every 1-2 hours.  Remember to change the batteries in your smoke detectors when changing your clock times in the spring and fall.  Use your seat belt every time you are in a car, and please drive safely and not be distracted with cell phones and texting while driving.   Ms. Bertz , Thank you for taking time to come for your Medicare Wellness Visit. I appreciate your ongoing commitment to your health goals. Please review the  following plan we discussed and let me know if I can assist you in the future.   These are the goals we discussed: Goals    None      This is a list of the screening recommended for you and due dates:  Health Maintenance  Topic Date Due  . Pneumonia vaccines (2 of 2 - PCV13) 02/18/2011  . Mammogram  06/01/2014  . Flu Shot  05/25/2015  . Colon Cancer Screening  04/15/2020  . Tetanus Vaccine  06/21/2021  . DEXA scan (bone density measurement)  Completed  . Shingles Vaccine  Completed   Pneumonia vaccine given today--you are now up to date.  Consider following up with your eye doctor--vision was quite poor today. Schedule your mammogram. Schedule your Bone Density test (they will likely call you, but order is in the computer so you can do this when you call to schedule your mammogram).

## 2015-04-15 NOTE — Progress Notes (Signed)
Chief Complaint  Patient presents with  . Annual Exam    fasting annual exam with pap. No concerns.   Sheryl Baker is a 69 y.o. female who presents for a complete physical and annual wellness visit.  She has no specific complaints.   Immunization History  Administered Date(s) Administered  . Influenza Split 07/01/2009, 07/04/2012, 07/05/2013  . Influenza Whole 07/28/2010, 06/22/2011  . Influenza, High Dose Seasonal PF 07/16/2014  . Pneumococcal Polysaccharide-23 02/17/2010  . Td 07/25/2005  . Tdap 06/22/2011  . Zoster 08/26/2011   Last Pap smear: 01/2010, no h/o abnormals Last mammogram: 05/2012 Last colonoscopy: 03/2010 Last DEXA: DEXA 02/2010 (mild osteopenia: T-1.3 at L fem neck), done at [facility]: every 6 months Ophtho: yearly Exercise: yardwork daily (mowing, moving mulch), walks 2-3 times/week for 60 minutes.  Annual Wellness Visit: Other doctors involved in patient's care:  Ophtho: Dr. Ellie Lunch Dentist: Dr. Mariea Clonts  GI: Dr. Oletta Lamas Derm: Dr. Teressa Senter  Depression and ADL screens in epic; completely normal. Of note, she came home to find one of her cats dead yesterday--a little teary in discussing this with nurse today.  End of Life Care: She now has healthcare power of attorney and living will--she will bring [place] copies.  She donates blood regularly, last time was about 10 days ago. Recalls Hg was 13.5.  Past Medical History  Diagnosis Date  . Postmenopausal   . Osteopenia     Past Surgical History  Procedure Laterality Date  . Excision of basal cell cancer  lower eyelid Dr.Christensen 02/2009  . Right throat nodule  removed(benign)Dr.Mundy 2001  . Breast surgery  left breast lump removed(benign)1974  . Tonsillectomy  age 56 or 52    History   Social History  . Marital Status: Married    Spouse Name: N/A  . Number of Children: N/A  . Years of Education: N/A   Occupational History  . 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 Smoking status: Never Smoker   . Smokeless tobacco: Never Used  . Alcohol Use: 0.0 oz/week    0 Standard drinks or equivalent per week     Comment: 1 glass of beer or wine 2 times a week or less  . Drug Use: No  . Sexual Activity: Not Currently   Other Topics Concern  . 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 1 cat.  Stepdaughter and son-in-law leave nearby    Family History  Problem Relation Age of Onset  . Dementia Mother   . Heart disease Father     valve replacement and bypass surgery in his 13's  . Cancer Father     renal cell carcinoma  . Alcohol abuse Sister   . Diabetes Sister   . Seizures Sister   . Pancreatitis Sister   . Dementia Sister 58    Outpatient Encounter Prescriptions as of 04/15/2015  Medication Sig Note  . Calcium Carbonate-Vitamin D (CALCIUM 600+D) 600-400 MG-UNIT per tablet Take 1 tablet by mouth daily.     . ferrous sulfate 325 (65 FE) MG tablet Take 325 mg by mouth daily with breakfast. 04/15/2015: Takes occasionally, usually prior to donating blood  . Glucosamine-Chondroit-Vit C-Mn (GLUCOSAMINE CHONDR 1500 COMPLX PO) Take 1 tablet by mouth daily.     . Multiple Vitamins-Minerals (MULTIVITAMIN WITH MINERALS) tablet Take 1 tablet by mouth daily.     Marland Kitchen  Omega-3 Fatty Acids (FISH OIL) 1000 MG CAPS Take 1 capsule by mouth daily.   08/13/2012: occasionally  . vitamin C (ASCORBIC ACID) 500 MG tablet Take 500 mg by mouth daily.     . [DISCONTINUED] aspirin 325 MG tablet Take 325 mg by mouth daily.      No facility-administered encounter medications on file as of 04/15/2015.    Allergies  Allergen Reactions  . Codeine Nausea And Vomiting      ROS: The patient denies anorexia, fever, weight changes, headaches, vision changes, decreased hearing, ear pain, sore throat, breast concerns, chest pain, palpitations, dizziness, syncope, dyspnea on  exertion, cough, swelling, nausea, vomiting, diarrhea, constipation, abdominal pain, melena, hematochezia, indigestion/heartburn, hematuria, incontinence, dysuria, vaginal bleeding/spotting, discharge, odor or itch, genital lesions, joint pains, numbness, tingling, weakness, tremor, suspicious skin lesions, depression, anxiety, abnormal bleeding/bruising, or enlarged lymph nodes.   PHYSICAL EXAM:  BP 130/80 mmHg  Pulse 84  Ht 5' 10" (1.778 m)  Wt 149 lb 6.4 oz (67.767 kg)  BMI 21.44 kg/m2  Vision with contacts was 20/70 together, 20/100 on right, 20/200 on the left (monovision contact)--last ophtho reportedly 09/2014.  General Appearance:   Alert, cooperative, no distress, appears stated age  Head:   Normocephalic, without obvious abnormality, atraumatic  Eyes:   PERRL, conjunctiva/corneas clear, EOM's intact, fundi not well visualized; cataract on the left  Ears:   Normal TM's and external ear canals  Nose:  Nares normal, mucosa normal, no drainage or sinus tenderness  Throat:  Lips, mucosa, and tongue normal; teeth and gums normal  Neck:  Supple, no lymphadenopathy; thyroid: no enlargement/tenderness/nodules; no carotid  bruit or JVD  Back:  Spine nontender, no curvature, ROM normal, no CVA tenderness  Lungs:   Clear to auscultation bilaterally without wheezes, rales or ronchi; respirations unlabored  Chest Wall:   No tenderness or deformity  Heart:   Regular rate and rhythm, S1 and S2 normal, no murmur, rub  or gallop  Breast Exam:   No tenderness, masses, or nipple discharge or inversion. No axillary lymphadenopathy  Abdomen:   Soft, non-tender, nondistended, normoactive bowel sounds,   no masses, no hepatosplenomegaly  Genitalia:   Normal external genitalia without lesions. BUS and vagina normal; cervix is normal without lesions. No cervical motion tenderness; bimanual exam normal--uterus and adnexa not enlarged,  nontender, no masses. Pap performed  Rectal:   Normal tone, no masses or tenderness; guaiac negative stool  Extremities:  No clubbing, cyanosis or edema  Pulses:  2+ and symmetric all extremities  Skin:  Skin color, texture, turgor normal, no rashes or lesions  Lymph nodes:  Cervical, supraclavicular, and axillary nodes normal  Neurologic:  CNII-XII intact, normal strength, sensation and gait; reflexes 2+ and symmetric throughout   Psych: Normal mood, affect, hygiene and grooming        ASSESSMENT/PLAN:  Annual physical exam - Plan: Visual acuity screening, POCT Urinalysis Dipstick, Lipid panel, Comprehensive metabolic panel, CBC with Differential/Platelet, TSH, Cytology - PAP [place]  Osteopenia - Plan: DG Bone Density  Medicare annual wellness visit, subsequent  Immunization due - Plan: Pneumococcal conjugate vaccine 13-valent  Encounter for routine gynecological examination - Plan: Cytology - PAP   Other fatigue - Plan: Comprehensive metabolic panel, CBC with Differential/Platelet, TSH  Pure hypercholesterolemia - Plan: Lipid panel   C-met, CBC, TSH, lipid. Only recheck vitamin D if significant decline in DEXA (was normal in 2012).   Mild osteopenia 02/2010. Discussed calcium, vitamin D, weight bearing exercise.  Repeat  DEXA, ordered.  Discussed monthly self breast exams and yearly mammograms; at least 30 minutes of aerobic activity at least 5 days/week; proper sunscreen use reviewed; healthy diet, including goals of calcium and vitamin D intake and alcohol recommendations (less than or equal to 1 drink/day) reviewed; regular seatbelt use; changing batteries in smoke detectors. Immunization recommendations discussed, Prevnar-13 today, continue yearly high dose flu shots. Colonoscopy recommendations reviewed, UTD.  Reviewed end of life issues-- MOST form updated and reviewed. Patient to get [place] a copy of her  healthcare power of attorney and living will.  F/u with ophtho re: poor vision.   Medicare Attestation I have personally reviewed: The patient's medical and social history Their use of alcohol, tobacco or illicit drugs Their current medications and supplements The patient's functional ability including ADLs,fall risks, home safety risks, cognitive, and hearing and visual impairment Diet and physical activities Evidence for depression or mood disorders  The patient's weight, height, BMI, and visual acuity have been recorded in the chart.  I have made referrals, counseling, and provided education to the patient based on review of the above and I have provided the patient with a written personalized care plan for preventive services.     KNAPP,EVE A, MD   04/15/2015

## 2015-04-16 ENCOUNTER — Encounter: Payer: Self-pay | Admitting: Family Medicine

## 2015-04-16 ENCOUNTER — Other Ambulatory Visit: Payer: Self-pay | Admitting: Family Medicine

## 2015-04-16 DIAGNOSIS — Z1231 Encounter for screening mammogram for malignant neoplasm of breast: Secondary | ICD-10-CM

## 2015-04-16 DIAGNOSIS — M8580 Other specified disorders of bone density and structure, unspecified site: Secondary | ICD-10-CM

## 2015-04-17 LAB — CYTOLOGY - PAP

## 2015-05-20 ENCOUNTER — Ambulatory Visit
Admission: RE | Admit: 2015-05-20 | Discharge: 2015-05-20 | Disposition: A | Discharge status: Home / Self Care | Payer: Medicare Other | Source: Ambulatory Visit | Attending: Family Medicine | Admitting: Family Medicine

## 2015-05-20 DIAGNOSIS — Z780 Asymptomatic menopausal state: Secondary | ICD-10-CM | POA: Diagnosis not present

## 2015-05-20 DIAGNOSIS — Z1231 Encounter for screening mammogram for malignant neoplasm of breast: Secondary | ICD-10-CM

## 2015-05-20 DIAGNOSIS — M8580 Other specified disorders of bone density and structure, unspecified site: Secondary | ICD-10-CM

## 2015-05-20 DIAGNOSIS — Z13820 Encounter for screening for osteoporosis: Secondary | ICD-10-CM | POA: Diagnosis not present

## 2015-05-20 LAB — HM DEXA SCAN: HM DEXA SCAN: NORMAL

## 2015-05-20 LAB — HM MAMMOGRAPHY

## 2015-05-26 ENCOUNTER — Encounter: Payer: Self-pay | Admitting: *Deleted

## 2015-05-27 ENCOUNTER — Encounter: Payer: Self-pay | Admitting: Internal Medicine

## 2015-10-25 HISTORY — PX: VARICOSE VEIN SURGERY: SHX832

## 2015-11-10 IMAGING — MG MM SCREENING BREAST TOMO BILATERAL
8 series · 9 of 24 positions shown · non-contrast
Comparison: Previous exam(s).

CLINICAL DATA: Screening.

EXAM:
DIGITAL SCREENING BILATERAL MAMMOGRAM WITH 3D TOMO WITH CAD

[R CC]
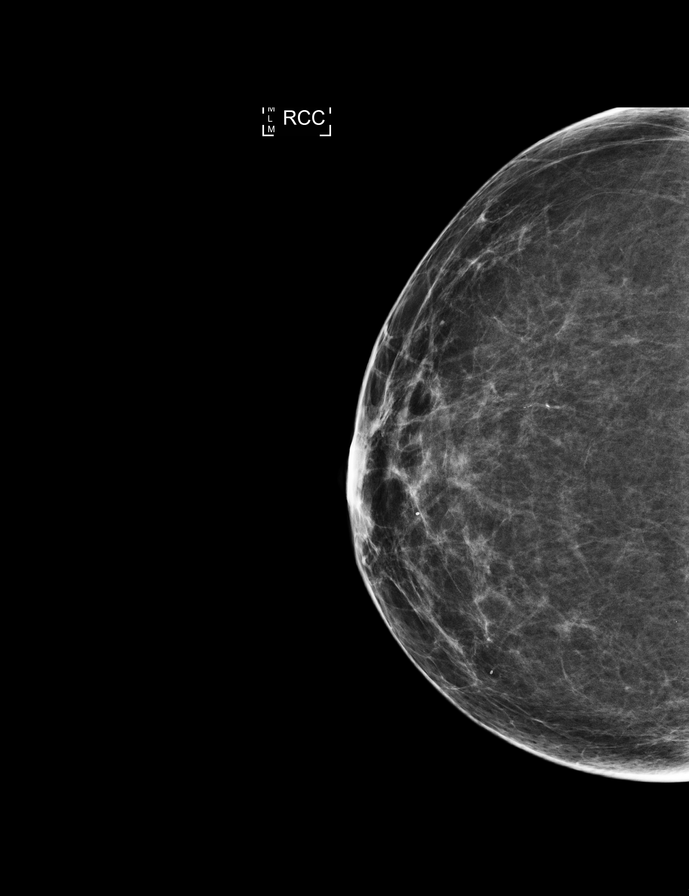

[L MLO]
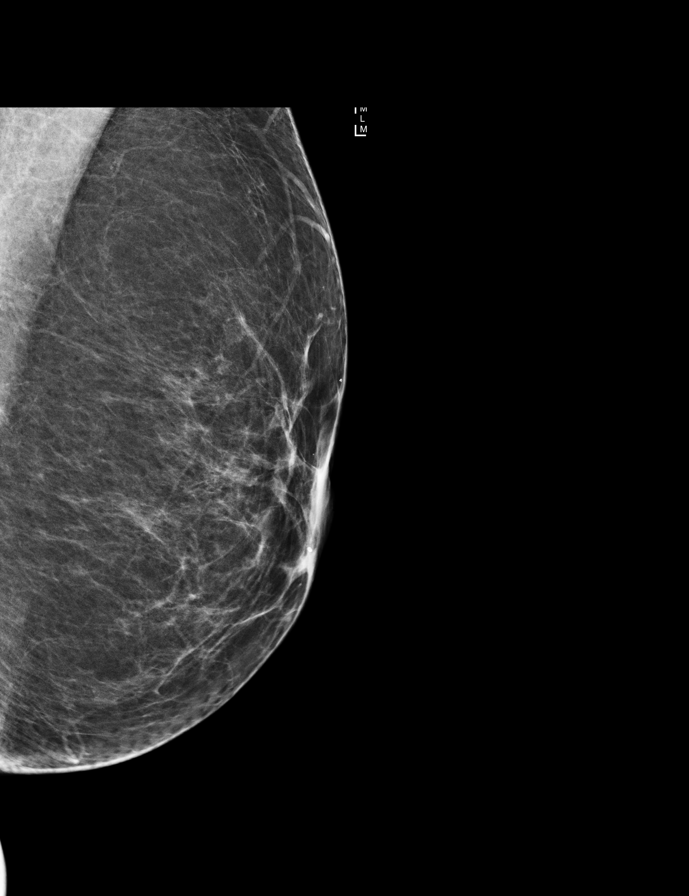

[R MLO]
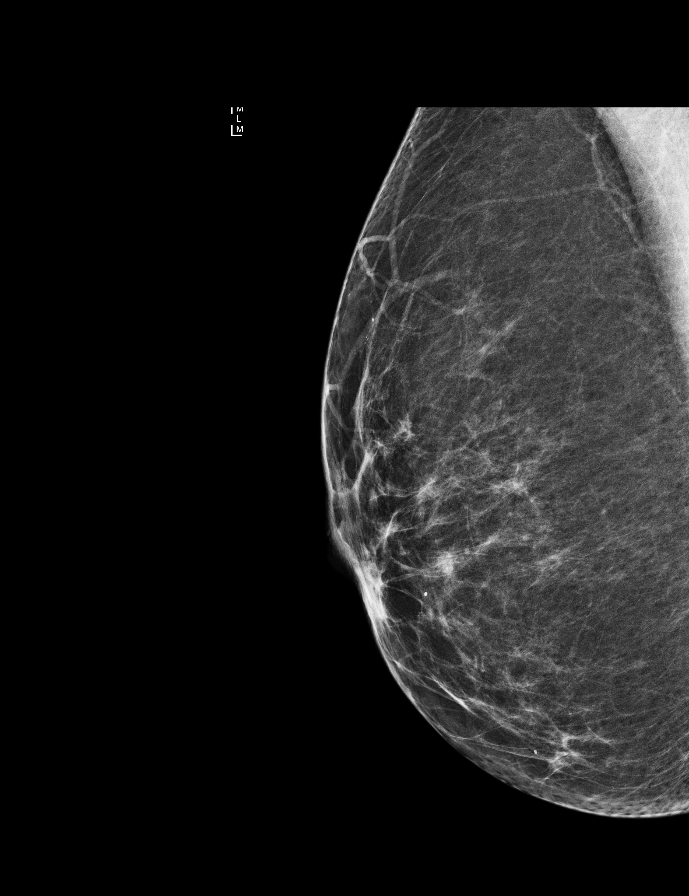

[L CC]
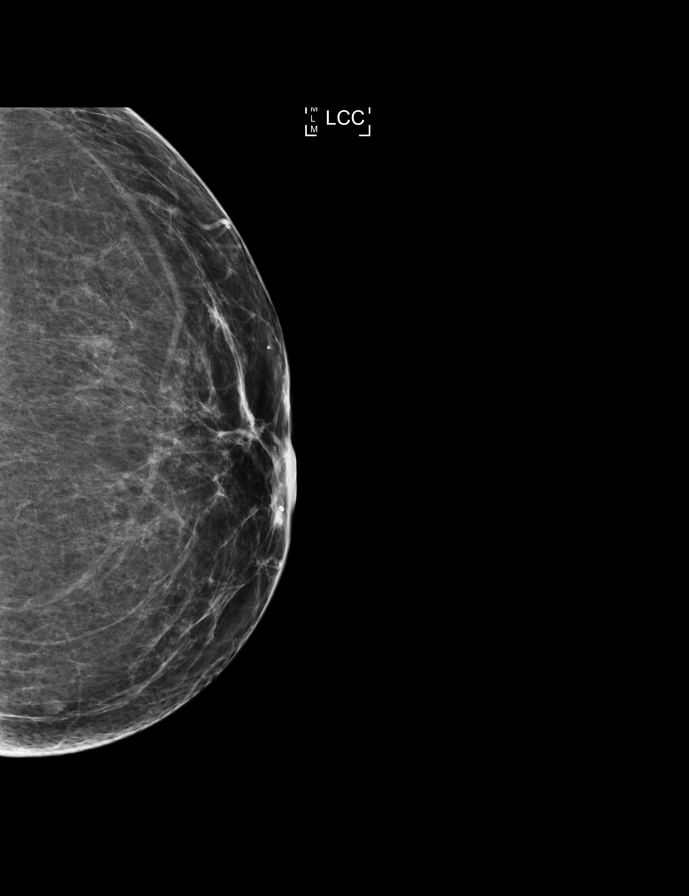

[R MLO tomo · 2 of 63 frames shown]
[frame 21/63]
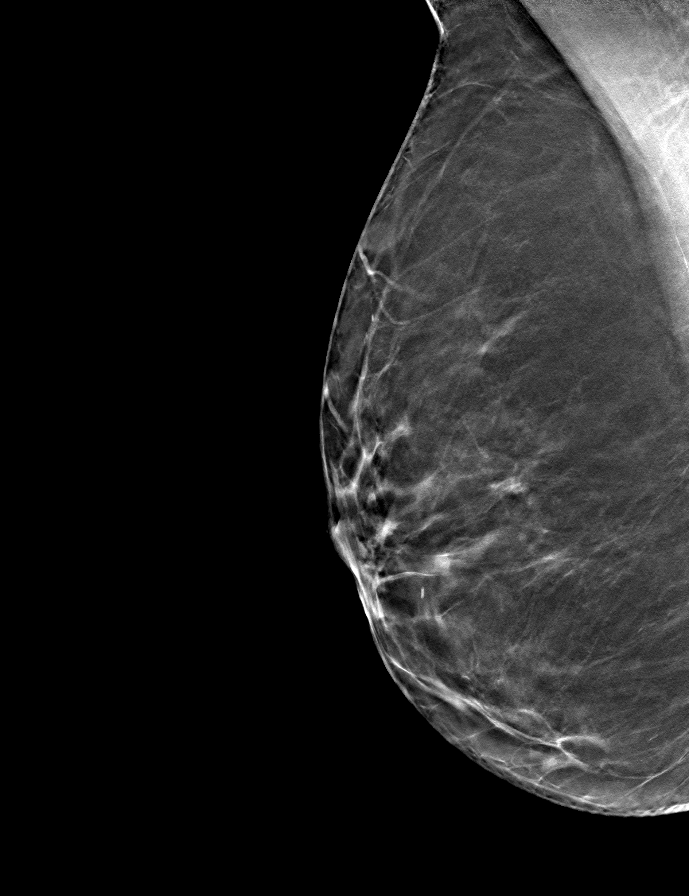
[frame 32/63]
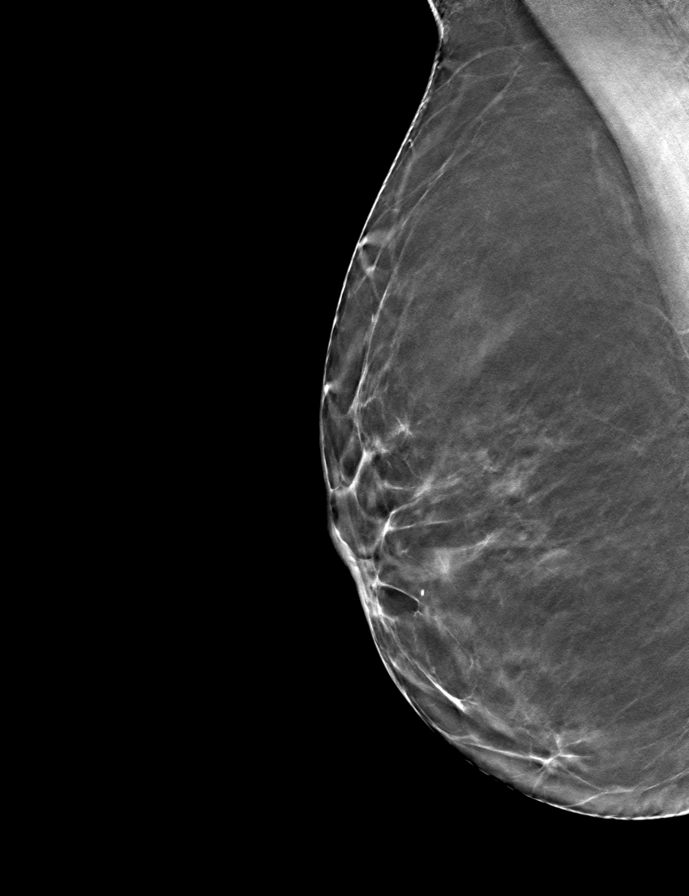

[L CC tomo · tomo slice 35/69.0]
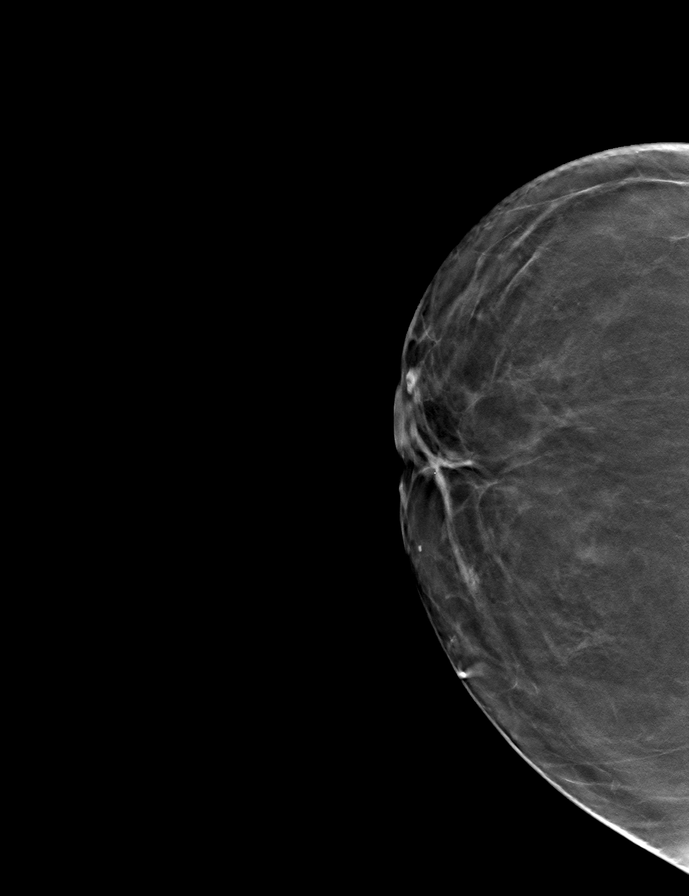

[R CC tomo · tomo slice 33/66.0]
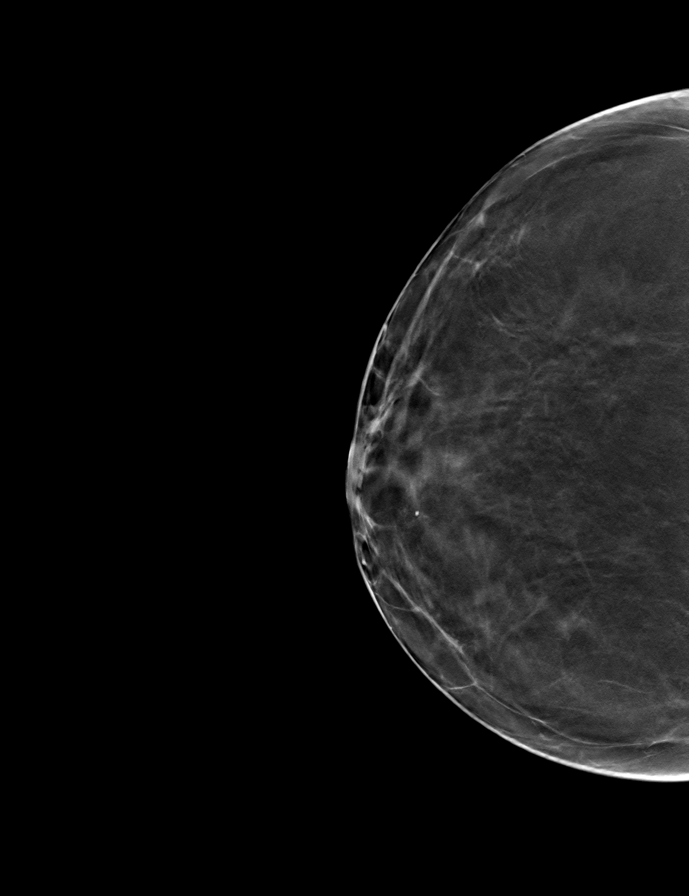

[L MLO tomo · tomo slice 33/65.0]
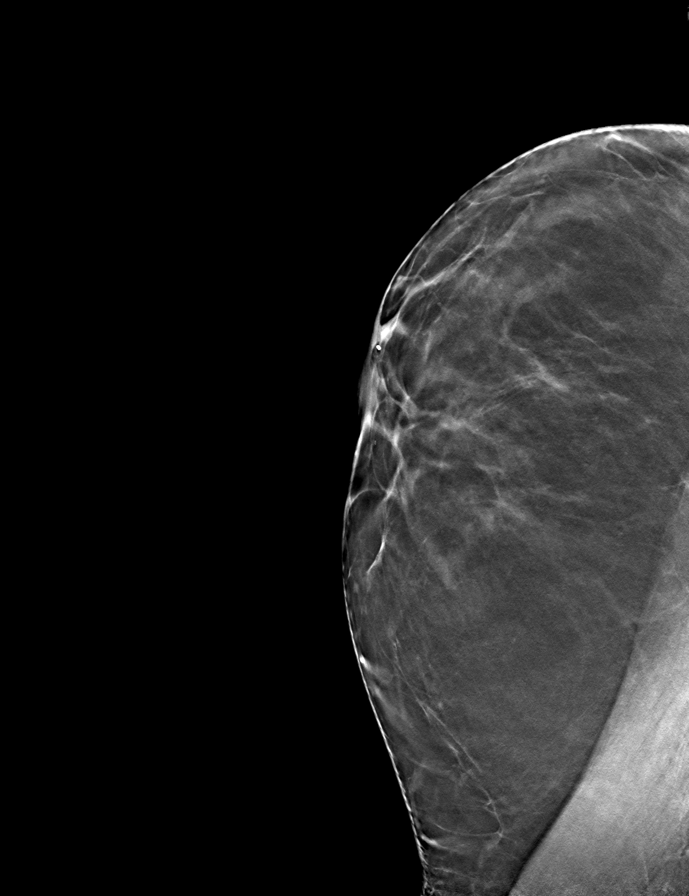

[9 of 24 positions shown; findings below may reference images not displayed]

ACR Breast Density Category b: There are scattered areas of
fibroglandular density.
FINDINGS: There are no findings suspicious for malignancy. Images were
processed with CAD.
IMPRESSION: No mammographic evidence of malignancy. A result letter of this
screening mammogram will be mailed directly to the patient.

RECOMMENDATION:
Screening mammogram in one year. (Code:55-L-23V)

BI-RADS CATEGORY  1: Negative.

## 2016-03-22 DIAGNOSIS — H52223 Regular astigmatism, bilateral: Secondary | ICD-10-CM | POA: Diagnosis not present

## 2016-03-28 DIAGNOSIS — H25813 Combined forms of age-related cataract, bilateral: Secondary | ICD-10-CM | POA: Diagnosis not present

## 2016-04-18 DIAGNOSIS — H25812 Combined forms of age-related cataract, left eye: Secondary | ICD-10-CM | POA: Diagnosis not present

## 2016-04-18 DIAGNOSIS — H2512 Age-related nuclear cataract, left eye: Secondary | ICD-10-CM | POA: Diagnosis not present

## 2016-04-20 ENCOUNTER — Encounter: Payer: Self-pay | Admitting: Family Medicine

## 2016-05-09 DIAGNOSIS — H2511 Age-related nuclear cataract, right eye: Secondary | ICD-10-CM | POA: Diagnosis not present

## 2016-05-09 DIAGNOSIS — H25811 Combined forms of age-related cataract, right eye: Secondary | ICD-10-CM | POA: Diagnosis not present

## 2016-05-31 DIAGNOSIS — I83813 Varicose veins of bilateral lower extremities with pain: Secondary | ICD-10-CM | POA: Diagnosis not present

## 2016-05-31 DIAGNOSIS — I83892 Varicose veins of left lower extremities with other complications: Secondary | ICD-10-CM | POA: Diagnosis not present

## 2016-06-01 DIAGNOSIS — I83813 Varicose veins of bilateral lower extremities with pain: Secondary | ICD-10-CM | POA: Diagnosis not present

## 2016-06-29 ENCOUNTER — Other Ambulatory Visit (INDEPENDENT_AMBULATORY_CARE_PROVIDER_SITE_OTHER): Payer: Medicare Other

## 2016-06-29 DIAGNOSIS — Z23 Encounter for immunization: Secondary | ICD-10-CM | POA: Diagnosis not present

## 2016-07-07 DIAGNOSIS — I8311 Varicose veins of right lower extremity with inflammation: Secondary | ICD-10-CM | POA: Diagnosis not present

## 2016-07-07 DIAGNOSIS — I83813 Varicose veins of bilateral lower extremities with pain: Secondary | ICD-10-CM | POA: Diagnosis not present

## 2016-07-07 DIAGNOSIS — I8312 Varicose veins of left lower extremity with inflammation: Secondary | ICD-10-CM | POA: Diagnosis not present

## 2016-07-27 DIAGNOSIS — I8312 Varicose veins of left lower extremity with inflammation: Secondary | ICD-10-CM | POA: Diagnosis not present

## 2016-07-27 DIAGNOSIS — I83892 Varicose veins of left lower extremities with other complications: Secondary | ICD-10-CM | POA: Diagnosis not present

## 2016-07-29 DIAGNOSIS — I83892 Varicose veins of left lower extremities with other complications: Secondary | ICD-10-CM | POA: Diagnosis not present

## 2016-07-29 DIAGNOSIS — I8312 Varicose veins of left lower extremity with inflammation: Secondary | ICD-10-CM | POA: Diagnosis not present

## 2016-08-04 DIAGNOSIS — I8312 Varicose veins of left lower extremity with inflammation: Secondary | ICD-10-CM | POA: Diagnosis not present

## 2016-08-04 DIAGNOSIS — I83812 Varicose veins of left lower extremities with pain: Secondary | ICD-10-CM | POA: Diagnosis not present

## 2016-08-08 ENCOUNTER — Other Ambulatory Visit: Payer: Self-pay | Admitting: Family Medicine

## 2016-08-08 DIAGNOSIS — Z1231 Encounter for screening mammogram for malignant neoplasm of breast: Secondary | ICD-10-CM

## 2016-08-23 DIAGNOSIS — I8312 Varicose veins of left lower extremity with inflammation: Secondary | ICD-10-CM | POA: Diagnosis not present

## 2016-08-23 DIAGNOSIS — I83892 Varicose veins of left lower extremities with other complications: Secondary | ICD-10-CM | POA: Diagnosis not present

## 2016-08-25 ENCOUNTER — Ambulatory Visit
Admission: RE | Admit: 2016-08-25 | Discharge: 2016-08-25 | Disposition: A | Discharge status: Home / Self Care | Payer: Medicare Other | Source: Ambulatory Visit | Attending: Family Medicine | Admitting: Family Medicine

## 2016-08-25 DIAGNOSIS — Z1231 Encounter for screening mammogram for malignant neoplasm of breast: Secondary | ICD-10-CM

## 2016-09-06 DIAGNOSIS — I8312 Varicose veins of left lower extremity with inflammation: Secondary | ICD-10-CM | POA: Diagnosis not present

## 2016-09-19 DIAGNOSIS — I8311 Varicose veins of right lower extremity with inflammation: Secondary | ICD-10-CM | POA: Diagnosis not present

## 2016-09-19 DIAGNOSIS — I83891 Varicose veins of right lower extremities with other complications: Secondary | ICD-10-CM | POA: Diagnosis not present

## 2016-09-19 DIAGNOSIS — M7981 Nontraumatic hematoma of soft tissue: Secondary | ICD-10-CM | POA: Diagnosis not present

## 2016-09-19 DIAGNOSIS — M79605 Pain in left leg: Secondary | ICD-10-CM | POA: Diagnosis not present

## 2016-09-21 DIAGNOSIS — I8311 Varicose veins of right lower extremity with inflammation: Secondary | ICD-10-CM | POA: Diagnosis not present

## 2016-10-05 DIAGNOSIS — I8311 Varicose veins of right lower extremity with inflammation: Secondary | ICD-10-CM | POA: Diagnosis not present

## 2016-10-07 DIAGNOSIS — I83812 Varicose veins of left lower extremities with pain: Secondary | ICD-10-CM | POA: Diagnosis not present

## 2016-10-07 DIAGNOSIS — I83813 Varicose veins of bilateral lower extremities with pain: Secondary | ICD-10-CM | POA: Diagnosis not present

## 2016-10-07 DIAGNOSIS — I8312 Varicose veins of left lower extremity with inflammation: Secondary | ICD-10-CM | POA: Diagnosis not present

## 2016-10-26 DIAGNOSIS — I8311 Varicose veins of right lower extremity with inflammation: Secondary | ICD-10-CM | POA: Diagnosis not present

## 2016-10-26 DIAGNOSIS — I83891 Varicose veins of right lower extremities with other complications: Secondary | ICD-10-CM | POA: Diagnosis not present

## 2016-11-08 DIAGNOSIS — I83891 Varicose veins of right lower extremities with other complications: Secondary | ICD-10-CM | POA: Diagnosis not present

## 2016-11-08 DIAGNOSIS — I8311 Varicose veins of right lower extremity with inflammation: Secondary | ICD-10-CM | POA: Diagnosis not present

## 2016-11-08 DIAGNOSIS — I83811 Varicose veins of right lower extremities with pain: Secondary | ICD-10-CM | POA: Diagnosis not present

## 2016-11-22 NOTE — Progress Notes (Signed)
Chief Complaint  Patient presents with  . Medicare Wellness    fasting AWV/CPE with pelvic. No concerns.     Sheryl Baker is a 71 y.o. female who presents for annual wellness visit and follow-up on chronic medical conditions.  She no concerns today.  Poor vision noted last year--she had bilateral cataract surgery and vision is now perfect.  H/o elevated cholesterol in the past (4 yrs ago), improved with diet. Last checked 03/2015.  Immunization History  Administered Date(s) Administered  . Influenza Split 07/01/2009, 07/04/2012, 07/05/2013  . Influenza Whole 07/28/2010, 06/22/2011  . Influenza, High Dose Seasonal PF 07/16/2014, 06/29/2016  . Pneumococcal Conjugate-13 04/15/2015  . Pneumococcal Polysaccharide-23 02/17/2010  . Td 07/25/2005  . Tdap 06/22/2011  . Zoster 08/26/2011  Last Pap smear: 03/2015 normal; no high risk HPV detected Last mammogram:08/2016 Last colonoscopy: 03/2010 Last DEXA: 04/2015, normal. Dentist: every 6 months Ophtho: yearly Exercise:Walks at least an hour, 3-4 days/week, plus 2 hours on Mondays, and walking friend's dogs 30 minutes once or twice most days. Yardwork daily seasonally (mowing, moving mulch).  Annual Wellness Visit: Other doctors involved in patient's care:  Ophtho: Dr. Nicki Reaper (Dr Arlina Robes did cataract surgery) Dentist: Dr. Mariea Clonts  GI: Dr. Oletta Lamas Derm: Dr. Teressa Senter  Depression screen negative Functional Status survey--unremarkable Fall screen: negative See full screens in epic.  End of Life Care: She has healthcare power of attorney and living will--she will bring [place] copies.  She donates blood regularly  Past Medical History:  Diagnosis Date  . Osteopenia    normal DEXA 04/2015  . Postmenopausal     Past Surgical History:  Procedure Laterality Date  . BREAST SURGERY  left breast lump removed(benign)1974  . CATARACT EXTRACTION, BILATERAL Bilateral 03/2016, 04/2016  . excision of basal cell cancer  lower eyelid  Dr.Christensen 02/2009  . right throat nodule  removed(benign)Dr.Mundy 2001  . TONSILLECTOMY  age 57 or 3  . VARICOSE VEIN SURGERY Bilateral 2017   endovenous laser  (Dr. Aleda Grana)    Social History   Social History  . Marital status: Married    Spouse name: N/A  . Number of children: N/A  . Years of education: N/A   Occupational History  . 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 Smoking status: Never Smoker  . Smokeless tobacco: Never Used  . Alcohol use 0.0 oz/week     Comment: 1 glass of beer or wine 2 times a week or less  . Drug use: No  . Sexual activity: Not Currently   Other Topics Concern  . 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 2 cats.  Stepdaughter and son-in-law leave nearby    Family History  Problem Relation Age of Onset  . Dementia Mother   . Heart disease Father     valve replacement and bypass surgery in his 44's  . Cancer Father     renal cell carcinoma  . Alcohol abuse Sister   . Diabetes Sister   . Seizures Sister   . Pancreatitis Sister   . Dementia Sister 11    Outpatient Encounter Prescriptions as of 11/24/2016  Medication Sig Note  . Calcium Carbonate-Vitamin D (CALCIUM 600+D) 600-400 MG-UNIT per tablet Take 1 tablet by mouth daily.     . ferrous sulfate 325 (65 FE) MG tablet Take 325 mg by mouth daily with breakfast.  04/15/2015: Takes occasionally, usually prior to donating blood  . Glucosamine-Chondroit-Vit C-Mn (GLUCOSAMINE CHONDR 1500 COMPLX PO) Take 1 tablet by mouth daily.     . Multiple Vitamins-Minerals (MULTIVITAMIN WITH MINERALS) tablet Take 1 tablet by mouth daily.     . Omega-3 Fatty Acids (FISH OIL) 1000 MG CAPS Take 1 capsule by mouth daily.   08/13/2012: occasionally  . vitamin C (ASCORBIC ACID) 500 MG tablet Take 500 mg by mouth daily.      No facility-administered encounter medications on  file as of 11/24/2016.     Allergies  Allergen Reactions  . Codeine Nausea And Vomiting    ROS: The patient denies anorexia, fever, weight changes, headaches, vision changes, decreased hearing, ear pain, sore throat, breast concerns, chest pain, palpitations, dizziness, syncope, dyspnea on exertion, cough, swelling, nausea, vomiting, diarrhea, constipation, abdominal pain, melena, hematochezia, indigestion/heartburn, hematuria, incontinence, dysuria, vaginal bleeding/spotting, discharge, odor or itch, genital lesions, joint pains, numbness, tingling, weakness, tremor, suspicious skin lesions, depression, anxiety, abnormal bleeding/bruising, or enlarged lymph nodes.    PHYSICAL EXAM:  BP 128/82 (BP Location: Left Arm, Patient Position: Sitting, Cuff Size: Normal)   Pulse 68   Ht 5' 9" (1.753 m)   Wt 147 lb (66.7 kg)   BMI 21.71 kg/m   General Appearance:   Alert, cooperative, no distress, appears stated age  Head:   Normocephalic, without obvious abnormality, atraumatic  Eyes:   PERRL, conjunctiva/corneas clear, EOM's intact, fundi benign  Ears:   Normal TM's and external ear canals  Nose:  Nares normal, mucosa normal, no drainage or sinus     tenderness  Throat:  Lips, mucosa, and tongue normal; teeth and gums normal  Neck:  Supple, no lymphadenopathy; thyroid: no   enlargement/tenderness/nodules; no carotid bruit or JVD  Back:  Spine nontender, no curvature, ROM normal, no CVA tenderness  Lungs:   Clear to auscultation bilaterally without wheezes, rales or ronchi; respirations unlabored  Chest Wall:   No tenderness or deformity  Heart:   Regular rate and rhythm, S1 and S2 normal, no murmur, rub  or gallop  Breast Exam:   No tenderness, masses, or nipple discharge or inversion. No axillary lymphadenopathy  Abdomen:   Soft, non-tender, nondistended, normoactive bowel sounds,   no masses, no hepatosplenomegaly  Genitalia:    Normal external genitalia without lesions. BUS and vagina normal; no cervical motion tenderness; bimanual exam normal--uterus and adnexa not enlarged, nontender, no masses. Pap not performed  Rectal:   Normal tone, no masses or tenderness; guaiac negative stool  Extremities:  No clubbing, cyanosis or edema  Pulses:  2+ and symmetric all extremities  Skin:  Skin color, texture, turgor normal, no rashes or lesions  Lymph nodes:  Cervical, supraclavicular, and axillary nodes normal  Neurologic:  CNII-XII intact, normal strength, sensation and gait; reflexes 2+ and symmetric throughout   Psych: Normal mood, affect, hygiene and grooming    ASSESSMENT/PLAN:  Annual physical exam - Plan: POCT Urinalysis Dipstick, Visual acuity screening, Lipid panel, Glucose, random  Hypercholesterolemia - improved with diet; recheck today - Plan: Lipid panel  Screening for diabetes mellitus - Plan: Glucose, random  Medicare annual wellness visit, subsequent   Next year check c-met, TSH, CBC with labs   Discussed monthly self breast exams and yearly mammograms; at least 30 minutes of aerobic activity at least 5 days/week; proper sunscreen use reviewed; healthy diet, including goals of calcium and vitamin D intake and alcohol recommendations (less than or equal to 1 drink/day) reviewed; regular  seatbelt use; changing batteries in smoke detectors. Immunization recommendations discussed, UTD; continue yearly high dose flu shots.Shingrix recommended when available. Colonoscopy recommendations reviewed, UTD.  Reviewed end of life issues-- MOST form updated and reviewed. Patient to get [place] a copy of her healthcare power of attorney and living will.   Medicare Attestation I have personally reviewed: The patient's medical and social history Their use of alcohol, tobacco or illicit drugs Their current medications and supplements The patient's functional ability  including ADLs,fall risks, home safety risks, cognitive, and hearing and visual impairment Diet and physical activities Evidence for depression or mood disorders  The patient's weight, height, BMI, and visual acuity have been recorded in the chart.  I have made referrals, counseling, and provided education to the patient based on review of the above and I have provided the patient with a written personalized care plan for preventive services.     Hamad Whyte A, MD   11/22/2016

## 2016-11-24 ENCOUNTER — Encounter: Payer: Self-pay | Admitting: Family Medicine

## 2016-11-24 ENCOUNTER — Ambulatory Visit (INDEPENDENT_AMBULATORY_CARE_PROVIDER_SITE_OTHER): Payer: Medicare Other | Admitting: Family Medicine

## 2016-11-24 VITALS — BP 128/82 | HR 68 | Ht 69.0 in | Wt 147.0 lb

## 2016-11-24 DIAGNOSIS — Z131 Encounter for screening for diabetes mellitus: Secondary | ICD-10-CM | POA: Diagnosis not present

## 2016-11-24 DIAGNOSIS — Z0000 Encounter for general adult medical examination without abnormal findings: Secondary | ICD-10-CM | POA: Diagnosis not present

## 2016-11-24 DIAGNOSIS — E7800 Pure hypercholesterolemia, unspecified: Secondary | ICD-10-CM

## 2016-11-24 LAB — LIPID PANEL
CHOL/HDL RATIO: 2.5 ratio (ref <5.0)
Cholesterol: 190 mg/dL (ref <200)
HDL: 75 mg/dL (ref >50)
LDL Cholesterol: 96 mg/dL (ref <100)
Triglycerides: 95 mg/dL (ref <150)
VLDL: 19 mg/dL (ref <30)

## 2016-11-24 LAB — POCT URINALYSIS DIPSTICK
Bilirubin, UA: NEGATIVE
Glucose, UA: NEGATIVE
KETONES UA: NEGATIVE
Leukocytes, UA: NEGATIVE
Nitrite, UA: NEGATIVE
Protein, UA: NEGATIVE
RBC UA: NEGATIVE
Urobilinogen, UA: NEGATIVE
pH, UA: 5.5

## 2016-11-24 LAB — GLUCOSE, RANDOM: Glucose, Bld: 88 mg/dL (ref 65–99)

## 2016-11-24 NOTE — Patient Instructions (Addendum)
  HEALTH MAINTENANCE RECOMMENDATIONS:  It is recommended that you get at least 30 minutes of aerobic exercise at least 5 days/week (for weight loss, you may need as much as 60-90 minutes). This can be any activity that gets your heart rate up. This can be divided in 10-15 minute intervals if needed, but try and build up your endurance at least once a week.  Weight bearing exercise is also recommended twice weekly.  Eat a healthy diet with lots of vegetables, fruits and fiber.  "Colorful" foods have a lot of vitamins (ie green vegetables, tomatoes, red peppers, etc).  Limit sweet tea, regular sodas and alcoholic beverages, all of which has a lot of calories and sugar.  Up to 1 alcoholic drink daily may be beneficial for women (unless trying to lose weight, watch sugars).  Drink a lot of water.  Calcium recommendations are 1200-1500 mg daily (1500 mg for postmenopausal women or women without ovaries), and vitamin D 1000 IU daily.  This should be obtained from diet and/or supplements (vitamins), and calcium should not be taken all at once, but in divided doses.  Monthly self breast exams and yearly mammograms for women over the age of 65 is recommended.  Sunscreen of at least SPF 30 should be used on all sun-exposed parts of the skin when outside between the hours of 10 am and 4 pm (not just when at beach or pool, but even with exercise, golf, tennis, and yard work!)  Use a sunscreen that says "broad spectrum" so it covers both UVA and UVB rays, and make sure to reapply every 1-2 hours.  Remember to change the batteries in your smoke detectors when changing your clock times in the spring and fall.  Use your seat belt every time you are in a car, and please drive safely and not be distracted with cell phones and texting while driving.   Ms. Bigwood , Thank you for taking time to come for your Medicare Wellness Visit. I appreciate your ongoing commitment to your health goals. Please review the  following plan we discussed and let me know if I can assist you in the future.   These are the goals we discussed: Goals    None      This is a list of the screening recommended for you and due dates:  Health Maintenance  Topic Date Due  .  Hepatitis C: One time screening is recommended by Center for Disease Control  (CDC) for  adults born from [birth year] through 1966.   [birth year]  . Mammogram  08/25/2018  . Colon Cancer Screening  04/15/2020  . Tetanus Vaccine  06/21/2021  . Flu Shot  Completed  . DEXA scan (bone density measurement)  Completed  . Shingles Vaccine  Completed  . Pneumonia vaccines  Completed   You are screened for hepatitis C when you donate blood. Mammograms are recommended yearly--due again 08/2017 (08/2018 as stated above).  I recommend getting the new shingles vaccine (Shingrix) when available. You will need to check with your insurance to see if it is covered, and if covered by Medicare Part D, you need to get from the pharmacy rather than our office.  It is a series of 2 injections, spaced 2 months apart.

## 2016-11-29 DIAGNOSIS — I8312 Varicose veins of left lower extremity with inflammation: Secondary | ICD-10-CM | POA: Diagnosis not present

## 2016-11-29 DIAGNOSIS — M7981 Nontraumatic hematoma of soft tissue: Secondary | ICD-10-CM | POA: Diagnosis not present

## 2016-11-29 DIAGNOSIS — I8311 Varicose veins of right lower extremity with inflammation: Secondary | ICD-10-CM | POA: Diagnosis not present

## 2016-12-15 ENCOUNTER — Other Ambulatory Visit: Payer: Medicare Other

## 2016-12-15 DIAGNOSIS — Z0000 Encounter for general adult medical examination without abnormal findings: Secondary | ICD-10-CM

## 2016-12-16 LAB — FECAL OCCULT BLOOD, IMMUNOCHEMICAL: FECAL OCCULT BLOOD: NEGATIVE

## 2017-01-30 DIAGNOSIS — I83813 Varicose veins of bilateral lower extremities with pain: Secondary | ICD-10-CM | POA: Diagnosis not present

## 2017-01-30 DIAGNOSIS — I83893 Varicose veins of bilateral lower extremities with other complications: Secondary | ICD-10-CM | POA: Diagnosis not present

## 2017-02-01 DIAGNOSIS — I8311 Varicose veins of right lower extremity with inflammation: Secondary | ICD-10-CM | POA: Diagnosis not present

## 2017-02-16 DIAGNOSIS — I8311 Varicose veins of right lower extremity with inflammation: Secondary | ICD-10-CM | POA: Diagnosis not present

## 2017-02-16 DIAGNOSIS — I83811 Varicose veins of right lower extremities with pain: Secondary | ICD-10-CM | POA: Diagnosis not present

## 2017-02-20 ENCOUNTER — Other Ambulatory Visit: Payer: Self-pay | Admitting: *Deleted

## 2017-02-20 DIAGNOSIS — R0989 Other specified symptoms and signs involving the circulatory and respiratory systems: Secondary | ICD-10-CM

## 2017-03-09 ENCOUNTER — Ambulatory Visit (HOSPITAL_COMMUNITY)
Admission: RE | Admit: 2017-03-09 | Discharge: 2017-03-09 | Disposition: A | Discharge status: Home / Self Care | Payer: Medicare Other | Source: Ambulatory Visit | Attending: Cardiology | Admitting: Cardiology

## 2017-03-09 DIAGNOSIS — R0989 Other specified symptoms and signs involving the circulatory and respiratory systems: Secondary | ICD-10-CM

## 2017-03-10 DIAGNOSIS — I83891 Varicose veins of right lower extremities with other complications: Secondary | ICD-10-CM | POA: Diagnosis not present

## 2017-03-10 DIAGNOSIS — I8311 Varicose veins of right lower extremity with inflammation: Secondary | ICD-10-CM | POA: Diagnosis not present

## 2017-04-06 DIAGNOSIS — Z9849 Cataract extraction status, unspecified eye: Secondary | ICD-10-CM | POA: Diagnosis not present

## 2017-04-06 DIAGNOSIS — H524 Presbyopia: Secondary | ICD-10-CM | POA: Diagnosis not present

## 2017-04-06 DIAGNOSIS — H2513 Age-related nuclear cataract, bilateral: Secondary | ICD-10-CM | POA: Diagnosis not present

## 2017-04-06 DIAGNOSIS — Z961 Presence of intraocular lens: Secondary | ICD-10-CM | POA: Diagnosis not present

## 2017-07-21 ENCOUNTER — Other Ambulatory Visit (INDEPENDENT_AMBULATORY_CARE_PROVIDER_SITE_OTHER): Payer: Medicare Other

## 2017-07-21 DIAGNOSIS — Z23 Encounter for immunization: Secondary | ICD-10-CM | POA: Diagnosis not present

## 2017-09-07 DIAGNOSIS — I8311 Varicose veins of right lower extremity with inflammation: Secondary | ICD-10-CM | POA: Diagnosis not present

## 2017-09-07 DIAGNOSIS — I8312 Varicose veins of left lower extremity with inflammation: Secondary | ICD-10-CM | POA: Diagnosis not present

## 2017-10-03 DIAGNOSIS — H02839 Dermatochalasis of unspecified eye, unspecified eyelid: Secondary | ICD-10-CM | POA: Diagnosis not present

## 2017-11-03 ENCOUNTER — Other Ambulatory Visit: Payer: Self-pay | Admitting: Family Medicine

## 2017-11-03 DIAGNOSIS — Z139 Encounter for screening, unspecified: Secondary | ICD-10-CM

## 2017-11-23 ENCOUNTER — Ambulatory Visit: Payer: Self-pay

## 2017-11-24 ENCOUNTER — Ambulatory Visit
Admission: RE | Admit: 2017-11-24 | Discharge: 2017-11-24 | Disposition: A | Discharge status: Home / Self Care | Payer: Medicare Other | Source: Ambulatory Visit | Attending: Family Medicine | Admitting: Family Medicine

## 2017-11-24 DIAGNOSIS — Z1231 Encounter for screening mammogram for malignant neoplasm of breast: Secondary | ICD-10-CM | POA: Diagnosis not present

## 2017-11-24 DIAGNOSIS — Z139 Encounter for screening, unspecified: Secondary | ICD-10-CM

## 2017-11-28 NOTE — Progress Notes (Signed)
Chief Complaint  Patient presents with  . Medicare Wellness    fasting AWV/CPE with pap/pelvic. Had eye exam in Dec with Dr Macarthur Critchley. No concerns.     Sheryl Baker is a 72 y.o. female who presents for annual physical exam and Medicare wellness visit.  She has no concerns today.  Prior h/o elevated cholesterol in the past, improved with diet. Last checked a year ago, and was good. Lab Results  Component Value Date   CHOL 190 11/24/2016   HDL 75 11/24/2016   LDLCALC 96 11/24/2016   TRIG 95 11/24/2016   CHOLHDL 2.5 11/24/2016    Immunization History  Administered Date(s) Administered  . Influenza Split 07/01/2009, 07/04/2012, 07/05/2013  . Influenza Whole 07/28/2010, 06/22/2011  . Influenza, High Dose Seasonal PF 07/16/2014, 06/29/2016, 07/21/2017  . Pneumococcal Conjugate-13 04/15/2015  . Pneumococcal Polysaccharide-23 02/17/2010  . Td 07/25/2005  . Tdap 06/22/2011  . Zoster 08/26/2011   Last Pap smear: 03/2015 normal; no high risk HPV detected Last mammogram: 11/2017 Last colonoscopy: 03/2010 Last DEXA: 04/2015, normal. Dentist: every 6 months Ophtho: yearly Exercise:Walks at least an hour, 3-4 days/week, plus 2 hours on Mondays (taking all the neighborhood trash cans out), and walking friend's dogs 30 minutes once or twice most days. Yardwork daily seasonally (mowing, moving mulch). Vitamin D-OH screen normal (52) in 2012  Annual Wellness Visit: Other doctors involved in patient's care:  Ophtho: Dr. Nicki Reaper (Dr Arlina Robes did cataract surgery) Dentist: Dr. Mariea Clonts  GI: Dr. Oletta Lamas Derm: Dr. Teressa Senter  Depression screen negative Functional Status survey--unremarkable Fall screen: negative See full screens in epic.  End of Life Care: She has healthcare power of attorney and living will; copies are scanned in chart (07/2017)  She donates blood regularly  Past Medical History:  Diagnosis Date  . Osteopenia    normal DEXA 04/2015  . Postmenopausal     Past  Surgical History:  Procedure Laterality Date  . BREAST EXCISIONAL BIOPSY Left   . BREAST SURGERY  left breast lump removed(benign)1974  . CATARACT EXTRACTION, BILATERAL Bilateral 03/2016, 04/2016  . excision of basal cell cancer  lower eyelid Dr.Christensen 02/2009  . right throat nodule  removed(benign)Dr.Mundy 2001  . TONSILLECTOMY  age 14 or 66  . VARICOSE VEIN SURGERY Bilateral 2017   endovenous laser  (Dr. Aleda Grana)    Social History   Socioeconomic History  . Marital status: Married    Spouse name: Not on file  . Number of children: Not on file  . Years of education: Not on file  . Highest education level: Not on file  Social Needs  . Financial resource strain: Not on file  . Food insecurity - worry: Not on file  . Food insecurity - inability: Not on file  . Transportation needs - medical: Not on file  . Transportation needs - non-medical: Not on file  Occupational History  . 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 Smoking status: Never Smoker  . Smokeless tobacco: Never Used  Substance and Sexual Activity  . Alcohol use: Yes    Alcohol/week: 0.0 oz    Comment: 1 glass of beer or wine 2 times a week or less  . Drug use: No  . Sexual activity: Not Currently  Other Topics Concern  . Not on file  Social History Narrative   Lives with her husband; she is his caretaker since he has had 2 strokes. 2 cats.  Stepdaughter  and son-in-law leave nearby    Family History  Problem Relation Age of Onset  . Dementia Mother   . Heart disease Father        valve replacement and bypass surgery in his 14's  . Cancer Father        renal cell carcinoma  . Alcohol abuse Sister   . Diabetes Sister   . Seizures Sister   . Pancreatitis Sister   . Dementia Sister 57    Outpatient Encounter Medications as of 11/29/2017  Medication Sig Note  . Calcium Carbonate-Vitamin D (CALCIUM 600+D) 600-400 MG-UNIT per tablet  Take 1 tablet by mouth daily.     . ferrous sulfate 325 (65 FE) MG tablet Take 325 mg by mouth daily with breakfast. 04/15/2015: Takes occasionally, usually prior to donating blood  . Glucosamine-Chondroit-Vit C-Mn (GLUCOSAMINE CHONDR 1500 COMPLX PO) Take 1 tablet by mouth daily.     . Multiple Vitamins-Minerals (MULTIVITAMIN WITH MINERALS) tablet Take 1 tablet by mouth daily.     . Omega-3 Fatty Acids (FISH OIL) 1000 MG CAPS Take 1 capsule by mouth daily.   08/13/2012: occasionally  . vitamin C (ASCORBIC ACID) 500 MG tablet Take 500 mg by mouth daily.      No facility-administered encounter medications on file as of 11/29/2017.     Allergies  Allergen Reactions  . Codeine Nausea And Vomiting    ROS: The patient denies anorexia, fever, headaches, vision changes, decreased hearing, ear pain, sore throat, breast concerns, chest pain, palpitations, dizziness, syncope, dyspnea on exertion, cough, swelling, nausea, vomiting, diarrhea, constipation, abdominal pain, melena, hematochezia, indigestion/heartburn, hematuria, incontinence, dysuria, vaginal bleeding/spotting, discharge, odor or itch, genital lesions, joint pains, numbness, tingling, weakness, tremor, suspicious skin lesions, depression, anxiety, abnormal bleeding/bruising, or enlarged lymph nodes. Some hand/finger pain/arthritis (sore after a lot of yardwork; denies morning stiffness). 8# weight gain in the last year noted. Denies changes in how clothing fits, changes in diet or exercise.   PHYSICAL EXAM:  BP 128/82   Pulse 72   Ht 5' 9.5" (1.765 m)   Wt 155 lb (70.3 kg)   BMI 22.56 kg/m   Wt Readings from Last 3 Encounters:  11/29/17 155 lb (70.3 kg)  11/24/16 147 lb (66.7 kg)  04/15/15 149 lb 6.4 oz (67.8 kg)    General Appearance:   Alert, cooperative, no distress, appears stated age  Head:   Normocephalic, without obvious abnormality, atraumatic  Eyes:   PERRL, conjunctiva/corneas clear, EOM's intact, fundi benign   Ears:   Normal TM's and external ear canals  Nose:  Nares normal, mucosa normal, no drainage or sinus tenderness  Throat:  Lips, mucosa, and tongue normal; teeth and gums normal  Neck:  Supple, no lymphadenopathy; thyroid: no enlargement/tenderness/ nodules; no carotid bruit or JVD  Back:  Spine nontender, no curvature, ROM normal, no CVA tenderness  Lungs:   Clear to auscultation bilaterally without wheezes, rales or ronchi; respirations unlabored  Chest Wall:   No tenderness or deformity  Heart:   Regular rate and rhythm, S1 and S2 normal, no murmur, rub or gallop  Breast Exam:   No tenderness, masses, or nipple discharge or inversion. No axillary lymphadenopathy  Abdomen:   Soft, non-tender, nondistended, normoactive bowel sounds,   no masses, no hepatosplenomegaly  Genitalia:   Normal external genitalia without lesions. BUS and vagina normal; no cervical motion tenderness; bimanual exam normal--uterus and adnexa not enlarged, nontender, no masses. Pap not performed  Rectal:   Normal tone, no masses  or tenderness; guaiac negative stool  Extremities:  No clubbing, cyanosis or edema. R index finger with bony deformity at DIP c/w OA  Pulses:  2+ and symmetric all extremities  Skin:  Skin color, texture, turgor normal, no rashes or lesions  Lymph nodes:  Cervical, supraclavicular, and axillary nodes normal  Neurologic:  CNII-XII intact, normal strength, sensation and gait; reflexes 2+ and symmetric throughout   Psych: Normal mood, affect, hygiene and grooming    ASSESSMENT/PLAN:  Annual physical exam - Plan: POCT Urinalysis DIP (Proadvantage Device), CBC with Differential/Platelet, TSH, Comprehensive metabolic panel  Medicare annual wellness visit, subsequent  Weight gain - Plan: TSH   c-met, TSH, CBC  (Last donated blood 07/2017, didn't in December due to snow)  Discussed monthly self breast  exams and yearly mammograms; at least 30 minutes of aerobic activity at least 5 days/week; proper sunscreen use reviewed; healthy diet, including goals of calcium and vitamin D intake and alcohol recommendations (less than or equal to 1 drink/day) reviewed; regular seatbelt use; changing batteries in smoke detectors. Immunization recommendations discussed, UTD; continue yearly high dose flu shots.Shingrix recommended, risks/side effects reviewed. Colonoscopy recommendations reviewed, UTD. May consider Cologard when next due, in 2021. Pap in 11/2019 (just under 5 years from last)  Reviewed end of life issues-- MOST form updated and reviewed.  Full Code, Full care  Medicare Attestation I have personally reviewed: The patient's medical and social history Their use of alcohol, tobacco or illicit drugs Their current medications and supplements The patient's functional ability including ADLs,fall risks, home safety risks, cognitive, and hearing and visual impairment Diet and physical activities Evidence for depression or mood disorders  The patient's weight, height and BMI have been recorded in the chart.  I have made referrals, counseling, and provided education to the patient based on review of the above and I have provided the patient with a written personalized care plan for preventive services.

## 2017-11-29 ENCOUNTER — Ambulatory Visit (INDEPENDENT_AMBULATORY_CARE_PROVIDER_SITE_OTHER): Payer: Medicare Other | Admitting: Family Medicine

## 2017-11-29 ENCOUNTER — Encounter: Payer: Self-pay | Admitting: Family Medicine

## 2017-11-29 VITALS — BP 128/82 | HR 72 | Ht 69.5 in | Wt 155.0 lb

## 2017-11-29 DIAGNOSIS — Z0000 Encounter for general adult medical examination without abnormal findings: Secondary | ICD-10-CM

## 2017-11-29 DIAGNOSIS — R635 Abnormal weight gain: Secondary | ICD-10-CM

## 2017-11-29 LAB — POCT URINALYSIS DIP (PROADVANTAGE DEVICE)
BILIRUBIN UA: NEGATIVE
BILIRUBIN UA: NEGATIVE mg/dL
Glucose, UA: NEGATIVE mg/dL
Leukocytes, UA: NEGATIVE
Nitrite, UA: NEGATIVE
PH UA: 6 (ref 5.0–8.0)
Protein Ur, POC: NEGATIVE mg/dL
RBC UA: NEGATIVE
SPECIFIC GRAVITY, URINE: 1.03
Urobilinogen, Ur: NEGATIVE

## 2017-11-29 NOTE — Patient Instructions (Signed)
  HEALTH MAINTENANCE RECOMMENDATIONS:  It is recommended that you get at least 30 minutes of aerobic exercise at least 5 days/week (for weight loss, you may need as much as 60-90 minutes). This can be any activity that gets your heart rate up. This can be divided in 10-15 minute intervals if needed, but try and build up your endurance at least once a week.  Weight bearing exercise is also recommended twice weekly.  Eat a healthy diet with lots of vegetables, fruits and fiber.  "Colorful" foods have a lot of vitamins (ie green vegetables, tomatoes, red peppers, etc).  Limit sweet tea, regular sodas and alcoholic beverages, all of which has a lot of calories and sugar.  Up to 1 alcoholic drink daily may be beneficial for women (unless trying to lose weight, watch sugars).  Drink a lot of water.  Calcium recommendations are 1200-1500 mg daily (1500 mg for postmenopausal women or women without ovaries), and vitamin D 1000 IU daily.  This should be obtained from diet and/or supplements (vitamins), and calcium should not be taken all at once, but in divided doses.  Monthly self breast exams and yearly mammograms for women over the age of 2 is recommended.  Sunscreen of at least SPF 30 should be used on all sun-exposed parts of the skin when outside between the hours of 10 am and 4 pm (not just when at beach or pool, but even with exercise, golf, tennis, and yard work!)  Use a sunscreen that says "broad spectrum" so it covers both UVA and UVB rays, and make sure to reapply every 1-2 hours.  Remember to change the batteries in your smoke detectors when changing your clock times in the spring and fall.  Use your seat belt every time you are in a car, and please drive safely and not be distracted with cell phones and texting while driving.   Ms. Kadow , Thank you for taking time to come for your Medicare Wellness Visit. I appreciate your ongoing commitment to your health goals. Please review the  following plan we discussed and let me know if I can assist you in the future.   These are the goals we discussed: Goals    None      This is a list of the screening recommended for you and due dates:  Health Maintenance  Topic Date Due  . Mammogram  11/25/2019  . Colon Cancer Screening  04/15/2020  . Tetanus Vaccine  06/21/2021  . Flu Shot  Completed  . DEXA scan (bone density measurement)  Completed  .  Hepatitis C: One time screening is recommended by Center for Disease Control  (CDC) for  adults born from [birth year] through 1966.   Completed  . Pneumonia vaccines  Completed   Continue yearly mammograms (due next 11/2018, not 2021 as stated above). We can consider Cologard in place of colonoscopy in 2021, when next colon cancer screening is due.  I recommend getting the new shingles vaccine (Shingrix). You will need to check with your insurance to see if it is covered, and if covered by Medicare Part D, you need to get from the pharmacy rather than our office.  It is a series of 2 injections, spaced 2 months apart.

## 2017-11-30 LAB — CBC WITH DIFFERENTIAL/PLATELET
BASOS: 1 %
Basophils Absolute: 0 10*3/uL (ref 0.0–0.2)
EOS (ABSOLUTE): 0.1 10*3/uL (ref 0.0–0.4)
EOS: 2 %
HEMATOCRIT: 38.5 % (ref 34.0–46.6)
Hemoglobin: 13.1 g/dL (ref 11.1–15.9)
Immature Grans (Abs): 0 10*3/uL (ref 0.0–0.1)
Immature Granulocytes: 0 %
LYMPHS ABS: 2.2 10*3/uL (ref 0.7–3.1)
Lymphs: 38 %
MCH: 29.9 pg (ref 26.6–33.0)
MCHC: 34 g/dL (ref 31.5–35.7)
MCV: 88 fL (ref 79–97)
MONOS ABS: 0.6 10*3/uL (ref 0.1–0.9)
Monocytes: 10 %
NEUTROS ABS: 3 10*3/uL (ref 1.4–7.0)
NEUTROS PCT: 49 %
PLATELETS: 223 10*3/uL (ref 150–379)
RBC: 4.38 x10E6/uL (ref 3.77–5.28)
RDW: 15 % (ref 12.3–15.4)
WBC: 5.9 10*3/uL (ref 3.4–10.8)

## 2017-11-30 LAB — COMPREHENSIVE METABOLIC PANEL
A/G RATIO: 1.5 (ref 1.2–2.2)
ALBUMIN: 4.3 g/dL (ref 3.5–4.8)
ALK PHOS: 83 IU/L (ref 39–117)
ALT: 27 IU/L (ref 0–32)
AST: 33 IU/L (ref 0–40)
BUN / CREAT RATIO: 15 (ref 12–28)
BUN: 10 mg/dL (ref 8–27)
Bilirubin Total: 0.5 mg/dL (ref 0.0–1.2)
CO2: 25 mmol/L (ref 20–29)
Calcium: 9.8 mg/dL (ref 8.7–10.3)
Chloride: 103 mmol/L (ref 96–106)
Creatinine, Ser: 0.65 mg/dL (ref 0.57–1.00)
GFR calc Af Amer: 102 mL/min/{1.73_m2} (ref >59)
GFR calc non Af Amer: 88 mL/min/{1.73_m2} (ref >59)
GLOBULIN, TOTAL: 2.8 g/dL (ref 1.5–4.5)
Glucose: 92 mg/dL (ref 65–99)
POTASSIUM: 4.1 mmol/L (ref 3.5–5.2)
SODIUM: 143 mmol/L (ref 134–144)
Total Protein: 7.1 g/dL (ref 6.0–8.5)

## 2017-11-30 LAB — TSH: TSH: 3.72 u[IU]/mL (ref 0.450–4.500)

## 2018-04-06 DIAGNOSIS — H524 Presbyopia: Secondary | ICD-10-CM | POA: Diagnosis not present

## 2018-04-06 DIAGNOSIS — H40013 Open angle with borderline findings, low risk, bilateral: Secondary | ICD-10-CM | POA: Diagnosis not present

## 2018-11-12 ENCOUNTER — Encounter: Payer: Self-pay | Admitting: *Deleted

## 2018-12-01 NOTE — Patient Instructions (Addendum)
HEALTH MAINTENANCE RECOMMENDATIONS:  It is recommended that you get at least 30 minutes of aerobic exercise at least 5 days/week (for weight loss, you may need as much as 60-90 minutes). This can be any activity that gets your heart rate up. This can be divided in 10-15 minute intervals if needed, but try and build up your endurance at least once a week.  Weight bearing exercise is also recommended twice weekly.  Eat a healthy diet with lots of vegetables, fruits and fiber.  "Colorful" foods have a lot of vitamins (ie green vegetables, tomatoes, red peppers, etc).  Limit sweet tea, regular sodas and alcoholic beverages, all of which has a lot of calories and sugar.  Up to 1 alcoholic drink daily may be beneficial for women (unless trying to lose weight, watch sugars).  Drink a lot of water.  Calcium recommendations are 1200-1500 mg daily (1500 mg for postmenopausal women or women without ovaries), and vitamin D 1000 IU daily.  This should be obtained from diet and/or supplements (vitamins), and calcium should not be taken all at once, but in divided doses.  Monthly self breast exams and yearly mammograms for women over the age of 25 is recommended.  Sunscreen of at least SPF 30 should be used on all sun-exposed parts of the skin when outside between the hours of 10 am and 4 pm (not just when at beach or pool, but even with exercise, golf, tennis, and yard work!)  Use a sunscreen that says "broad spectrum" so it covers both UVA and UVB rays, and make sure to reapply every 1-2 hours.  Remember to change the batteries in your smoke detectors when changing your clock times in the spring and fall.  Use your seat belt every time you are in a car, and please drive safely and not be distracted with cell phones and texting while driving.   Ms. Mapp , Thank you for taking time to come for your Medicare Wellness Visit. I appreciate your ongoing commitment to your health goals. Please review the  following plan we discussed and let me know if I can assist you in the future.   This is a list of the screening recommended for you and due dates:  Health Maintenance  Topic Date Due  . Mammogram  11/25/2019  . Colon Cancer Screening  04/15/2020  . Tetanus Vaccine  06/21/2021  . Flu Shot  Completed  . DEXA scan (bone density measurement)  Completed  .  Hepatitis C: One time screening is recommended by Center for Disease Control  (CDC) for  adults born from [birth year] through 1966.   Completed  . Pneumonia vaccines  Completed   Your mammogram is due this month (recommended yearly--the 2021 date above is incorrect). Remember to get the second Shingrix from the pharmacy in March.  Please try and monitor your blood pressure at home regularly, as it was elevated in the office today.  Goal is <130/80. If it is consistently over 135-140/90, you need to come back and discuss this.  Continue your daily exercise and low sodium diet. Please send in a list of your blood pressures within the month.  Low-Sodium Eating Plan Sodium, which is an element that makes up salt, helps you maintain a healthy balance of fluids in your body. Too much sodium can increase your blood pressure and cause fluid and waste to be held in your body. Your health care provider or dietitian may recommend following this plan if you have high blood  pressure (hypertension), kidney disease, liver disease, or heart failure. Eating less sodium can help lower your blood pressure, reduce swelling, and protect your heart, liver, and kidneys. What are tips for following this plan? General guidelines  Most people on this plan should limit their sodium intake to 1,500-2,000 mg (milligrams) of sodium each day. Reading food labels   The Nutrition Facts label lists the amount of sodium in one serving of the food. If you eat more than one serving, you must multiply the listed amount of sodium by the number of servings.  Choose foods with less  than 140 mg of sodium per serving.  Avoid foods with 300 mg of sodium or more per serving. Shopping  Look for lower-sodium products, often labeled as "low-sodium" or "no salt added."  Always check the sodium content even if foods are labeled as "unsalted" or "no salt added".  Buy fresh foods. ? Avoid canned foods and premade or frozen meals. ? Avoid canned, cured, or processed meats  Buy breads that have less than 80 mg of sodium per slice. Cooking  Eat more home-cooked food and less restaurant, buffet, and fast food.  Avoid adding salt when cooking. Use salt-free seasonings or herbs instead of table salt or sea salt. Check with your health care provider or pharmacist before using salt substitutes.  Cook with plant-based oils, such as canola, sunflower, or olive oil. Meal planning  When eating at a restaurant, ask that your food be prepared with less salt or no salt, if possible.  Avoid foods that contain MSG (monosodium glutamate). MSG is sometimes added to [place] food, bouillon, and some canned foods. What foods are recommended? The items listed may not be a complete list. Talk with your dietitian about what dietary choices are best for you. Grains Low-sodium cereals, including oats, puffed wheat and rice, and shredded wheat. Low-sodium crackers. Unsalted rice. Unsalted pasta. Low-sodium bread. Whole-grain breads and whole-grain pasta. Vegetables Fresh or frozen vegetables. "No salt added" canned vegetables. "No salt added" tomato sauce and paste. Low-sodium or reduced-sodium tomato and vegetable juice. Fruits Fresh, frozen, or canned fruit. Fruit juice. Meats and other protein foods Fresh or frozen (no salt added) meat, poultry, seafood, and fish. Low-sodium canned tuna and salmon. Unsalted nuts. Dried peas, beans, and lentils without added salt. Unsalted canned beans. Eggs. Unsalted nut butters. Dairy Milk. Soy milk. Cheese that is naturally low in sodium, such as ricotta  cheese, fresh mozzarella, or Swiss cheese Low-sodium or reduced-sodium cheese. Cream cheese. Yogurt. Fats and oils Unsalted butter. Unsalted margarine with no trans fat. Vegetable oils such as canola or olive oils. Seasonings and other foods Fresh and dried herbs and spices. Salt-free seasonings. Low-sodium mustard and ketchup. Sodium-free salad dressing. Sodium-free light mayonnaise. Fresh or refrigerated horseradish. Lemon juice. Vinegar. Homemade, reduced-sodium, or low-sodium soups. Unsalted popcorn and pretzels. Low-salt or salt-free chips. What foods are not recommended? The items listed may not be a complete list. Talk with your dietitian about what dietary choices are best for you. Grains Instant hot cereals. Bread stuffing, pancake, and biscuit mixes. Croutons. Seasoned rice or pasta mixes. Noodle soup cups. Boxed or frozen macaroni and cheese. Regular salted crackers. Self-rising flour. Vegetables Sauerkraut, pickled vegetables, and relishes. Olives. [place] fries. Onion rings. Regular canned vegetables (not low-sodium or reduced-sodium). Regular canned tomato sauce and paste (not low-sodium or reduced-sodium). Regular tomato and vegetable juice (not low-sodium or reduced-sodium). Frozen vegetables in sauces. Meats and other protein foods Meat or fish that is salted, canned, smoked,  spiced, or pickled. Bacon, ham, sausage, hotdogs, corned beef, chipped beef, packaged lunch meats, salt pork, jerky, pickled herring, anchovies, regular canned tuna, sardines, salted nuts. Dairy Processed cheese and cheese spreads. Cheese curds. Blue cheese. Feta cheese. String cheese. Regular cottage cheese. Buttermilk. Canned milk. Fats and oils Salted butter. Regular margarine. Ghee. Bacon fat. Seasonings and other foods Onion salt, garlic salt, seasoned salt, table salt, and sea salt. Canned and packaged gravies. Worcestershire sauce. Tartar sauce. Barbecue sauce. Teriyaki sauce. Soy sauce, including  reduced-sodium. Steak sauce. Fish sauce. Oyster sauce. Cocktail sauce. Horseradish that you find on the shelf. Regular ketchup and mustard. Meat flavorings and tenderizers. Bouillon cubes. Hot sauce and Tabasco sauce. Premade or packaged marinades. Premade or packaged taco seasonings. Relishes. Regular salad dressings. Salsa. Potato and tortilla chips. Corn chips and puffs. Salted popcorn and pretzels. Canned or dried soups. Pizza. Frozen entrees and pot pies. Summary  Eating less sodium can help lower your blood pressure, reduce swelling, and protect your heart, liver, and kidneys.  Most people on this plan should limit their sodium intake to 1,500-2,000 mg (milligrams) of sodium each day.  Canned, boxed, and frozen foods are high in sodium. Restaurant foods, fast foods, and pizza are also very high in sodium. You also get sodium by adding salt to food.  Try to cook at home, eat more fresh fruits and vegetables, and eat less fast food, canned, processed, or prepared foods. This information is not intended to replace advice given to you by your health care provider. Make sure you discuss any questions you have with your health care provider. Document Released: 04/01/2002 Document Revised: 10/03/2016 Document Reviewed: 10/03/2016 Elsevier Interactive Patient Education  2019 [employer].

## 2018-12-01 NOTE — Progress Notes (Signed)
Chief Complaint  Patient presents with  . Medicare Wellness    fasting AWV/CPE with pelvic. Sees eye doctor for eye exams. Fell while walking a neighbor's dog and she is still having some right sided pain.     Sheryl Baker is a 73 y.o. female who presents for annual physical exam and Medicare wellness visit. Her husband passed away in 10/20/23.  She has been cleaning out the house, got a new car (sold the accessible [place]), and has been able to travel to see family (just recently). She denies depression or need for grief counseling, is doing okay.  Prior h/o elevated cholesterol in the past, improved with diet. Last checked 2 years ago, and was good. Denies any significant changes to her diet (other than the food brought by others shortly after Al's passing). Lab Results  Component Value Date   CHOL 190 11/24/2016   HDL 75 11/24/2016   LDLCALC 96 11/24/2016   TRIG 95 11/24/2016   CHOLHDL 2.5 11/24/2016   BP elevated today.  Has blood pressure checked when she donates blood, recalls 120/70 in October.  Denies any significant changes in sodium content in her diet.  Denies headaches, chest pain. No decongestant use.   Immunization History  Administered Date(s) Administered  . Influenza Split 07/01/2009, 07/04/2012, 07/05/2013  . Influenza Whole 07/28/2010, 06/22/2011  . Influenza, High Dose Seasonal PF 07/16/2014, 06/29/2016, 07/21/2017, 07/26/2018  . Pneumococcal Conjugate-13 04/15/2015  . Pneumococcal Polysaccharide-23 02/17/2010  . Td 07/25/2005  . Tdap 06/22/2011  . Zoster 08/26/2011  . Zoster Recombinat (Shingrix) 11/08/2018   Last Pap smear:03/2015 normal; no high risk HPV detected Last mammogram: 11/2017; delayed scheduling until her left rib pain (from fall) Last colonoscopy: 03/2010 Dr.Edwards-mild int hemorrhoids, diverticulosis sigmoid colon, 69mm polyp proximal ascending colon, 94mm polyp rectum. Path benign (not adenomatous) Last DEXA:04/2015, normal. Dentist: every 6  months Ophtho: yearly Exercise: Walks at least 45 minutes 5-6 days/week (mostly with her friend's dog). Yardwork dailyseasonally(mowing, moving mulch).  Has some weights for wrists/ankles, not using much. Vitamin D-OH screen normal (52) in 2012  Annual Wellness Visit: Other doctors involved in patient's care:  Ophtho: Dr. Nicki Reaper (Dr Arlina Robes did cataract surgery) Dentist: Dr. Mariea Clonts  GI: Dr. Oletta Lamas Derm: Dr. Teressa Senter  Depression screen negative Functional Status survey--unremarkable Fall screen: had one fall, some bruising (hit left eye, ribs, wrist).  This was in early January, was leaning down tying her shoe and got knocked over by the dog while out walking in the rain, fell on her left side.  Residual soreness in her ribs and small lump above left temple/eye. Mini-Cog screen: normal See full screens in epic.  End of Life Care: She has healthcare power of attorney and living will; copies are scanned in chart (07/2017)  She donates blood regularly  Past Medical History:  Diagnosis Date  . Arthritis of finger   . Osteopenia    normal DEXA 04/2015  . Postmenopausal     Past Surgical History:  Procedure Laterality Date  . BREAST EXCISIONAL BIOPSY Left   . BREAST SURGERY  left breast lump removed(benign)1974  . CATARACT EXTRACTION, BILATERAL Bilateral 03/2016, 04/2016  . excision of basal cell cancer  lower eyelid Dr.Christensen 02/2009  . right throat nodule  removed(benign)Dr.Mundy 2001  . TONSILLECTOMY  age 22 or 72  . VARICOSE VEIN SURGERY Bilateral 2017   endovenous laser  (Dr. Aleda Grana)    Social History   Socioeconomic History  . Marital status: Married    Spouse name:  Not on file  . Number of children: Not on file  . Years of education: Not on file  . Highest education level: Not on file  Occupational History  . 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 Financial resource strain: Not on file   . Food insecurity:    Worry: Not on file    Inability: Not on file  . Transportation needs:    Medical: Not on file    Non-medical: Not on file  Tobacco Use  . Smoking status: Never Smoker  . Smokeless tobacco: Never Used  Substance and Sexual Activity  . Alcohol use: Yes    Alcohol/week: 0.0 standard drinks    Comment: 1 glass of beer or wine 2 times a week or less  . Drug use: No  . Sexual activity: Not Currently  Lifestyle  . Physical activity:    Days per week: Not on file    Minutes per session: Not on file  . Stress: Not on file  Relationships  . Social connections:    Talks on phone: Not on file    Gets together: Not on file    Attends religious service: Not on file    Active member of club or organization: Not on file    Attends meetings of clubs or organizations: Not on file    Relationship status: Not on file  . Intimate partner violence:    Fear of current or ex partner: Not on file    Emotionally abused: Not on file    Physically abused: Not on file    Forced sexual activity: Not on file  Other Topics Concern  . Not on file  Social History Narrative   Widowed 09/2018.    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 2 cats.  Stepdaughter and son-in-law leave nearby    Family History  Problem Relation Age of Onset  . Dementia Mother   . Heart disease Father        valve replacement and bypass surgery in his 47's  . Cancer Father        renal cell carcinoma  . Alcohol abuse Sister   . Diabetes Sister   . Seizures Sister   . Pancreatitis Sister   . Dementia Sister 44    Outpatient Encounter Medications as of 12/03/2018  Medication Sig Note  . Calcium Carbonate-Vitamin D (CALCIUM 600+D) 600-400 MG-UNIT per tablet Take 1 tablet by mouth daily.     . ferrous sulfate 325 (65 FE) MG tablet Take 325 mg by mouth daily with breakfast. 04/15/2015: Takes occasionally, usually prior to donating blood  . Glucosamine-Chondroit-Vit C-Mn (GLUCOSAMINE  CHONDR 1500 COMPLX PO) Take 1 tablet by mouth daily.     . Multiple Vitamins-Minerals (MULTIVITAMIN WITH MINERALS) tablet Take 1 tablet by mouth daily.     . Omega-3 Fatty Acids (FISH OIL) 1000 MG CAPS Take 1 capsule by mouth daily.     . vitamin C (ASCORBIC ACID) 500 MG tablet Take 500 mg by mouth daily.      No facility-administered encounter medications on file as of 12/03/2018.     Allergies  Allergen Reactions  . Codeine Nausea And Vomiting    ROS: The patient denies anorexia, fever, headaches, vision changes, decreased hearing, ear pain, sore throat, breast concerns, chest pain, palpitations, dizziness, syncope, dyspnea on exertion, cough, swelling, nausea, vomiting, diarrhea, constipation, abdominal pain, melena, hematochezia, indigestion/heartburn, hematuria,  incontinence, dysuria, vaginal bleeding/spotting, discharge, odor or itch, genital lesions, joint pains, numbness, tingling, weakness, tremor, suspicious skin lesions, depression, anxiety, abnormal bleeding/bruising, or enlarged lymph nodes. Some hand/finger pain/arthritis (sore after a lot of yardwork; denies morning stiffness). Broke her R 5th toe in October (got caught on piece of furniture)--still has some redness after wearing shoes, but denies any pain.  She buddy-taped it, didn't seek care. Still has pain at left ribs since her fall last month, improving.  Small lump above left temple since her fall--bruising on face resolved, lump is much smaller, nontender.   PHYSICAL EXAM:  BP (!) 170/80   Pulse 80   Ht 5' 9.5" (1.765 m)   Wt 154 lb 12.8 oz (70.2 kg)   BMI 22.53 kg/m   17088 on repeat by MD  Wt Readings from Last 3 Encounters:  12/03/18 154 lb 12.8 oz (70.2 kg)  11/29/17 155 lb (70.3 kg)  11/24/16 147 lb (66.7 kg)   BP Readings from Last 3 Encounters:  12/03/18 (!) 170/80  11/29/17 128/82  11/24/16 128/82    General Appearance:   Alert, cooperative, no distress, appears stated age  Head:    Normocephalic, without obvious abnormality. Small soft tissue swelling above left temple.  No bony swelling or step-off.  Eyes:   PERRL, conjunctiva/corneas clear, EOM's intact, fundi benign  Ears:   Normal TM's and external ear canals  Nose:  Nares normal, mucosa normal, no drainage or sinustenderness  Throat:  Lips, mucosa, and tongue normal; teeth and gums normal  Neck:  Supple, no lymphadenopathy; thyroid: no enlargement/tenderness/ nodules; no carotid bruit or JVD  Back:  Spine nontender, no curvature, ROM normal, no CVA tenderness  Lungs:   Clear to auscultation bilaterally without wheezes, rales or ronchi; respirations unlabored  Chest Wall:  Tender at lateral left ribs, slight bony stepoff noted. No bruising or swelling.  Remainder of chest wall is nontender  Heart:   Regular rate and rhythm, S1 and S2 normal, no murmur, rub or gallop  Breast Exam:   No tenderness, masses, or nipple discharge or inversion. No axillary lymphadenopathy  Abdomen:   Soft, non-tender, nondistended, normoactive bowel sounds,  no masses, no hepatosplenomegaly  Genitalia:   Normal external genitalia without lesions. BUS and vagina normal; no cervical motion tenderness; bimanual exam normal--uterus and adnexa not enlarged, nontender, no masses. Pap notperformed  Rectal:   Normal tone, no masses or tenderness; guaiac negative stool  Extremities:  No clubbing, cyanosis or edema. R index finger with bony deformity at DIP c/w OA. Right 5th toe with some mild erythema, slightly swollen, no warmth, nontender.  Pulses:  2+ and symmetric all extremities  Skin:  Skin color, texture, turgor normal, no rashes or lesions  Lymph nodes:  Cervical, supraclavicular, and axillary nodes normal  Neurologic:  CNII-XII intact, normal strength, sensation and gait; reflexes 2+ and symmetric throughout   Psych: Normal mood, affect, hygiene and  grooming  Urine dip normal   ASSESSMENT/PLAN:  Annual physical exam - Plan: POCT Urinalysis DIP (Proadvantage Device)  Medicare annual wellness visit, subsequent  Chest wall pain - suspect left rib fracture from fall last month; improving, no need for imaging  Blood pressure elevated without history of HTN - reviewed possible causes; to monitor elsewhere; low sodium diet. F/u if remains elevated   No labs this year, plan for next year (or if her BP remains elevated).  Discussed monthly self breast exams and yearly mammograms (due now); at least 30 minutes of  aerobic activity at least 5 days/week; proper sunscreen use reviewed; healthy diet, including goals of calcium and vitamin D intake and alcohol recommendations (less than or equal to 1 drink/day) reviewed; regular seatbelt use; changing batteries in smoke detectors. Immunization recommendations discussed, UTD;continue yearly high dose flu shots.Reminded to get second Shingrix from pharmacy in March.Colonoscopy recommendations reviewed, UTD. May consider Cologuard when next due, in 2021. Previously discussed pap to be done next in 11/2019 (just under 5 years from last)--won't be needed, due to age.  Reviewed end of life issues-- MOST form updated and reviewed.  Full Code, Full care   Please try and monitor your blood pressure at home regularly, as it was elevated in the office today.  Goal is <130/80. If it is consistently over 135-140/90, you need to come back and discuss this.  Continue your daily exercise and low sodium diet. Please send in a list of your blood pressures within the month.   Medicare Attestation I have personally reviewed: The patient's medical and social history Their use of alcohol, tobacco or illicit drugs Their current medications and supplements The patient's functional ability including ADLs,fall risks, home safety risks, cognitive, and hearing and visual impairment Diet and physical  activities Evidence for depression or mood disorders  The patient's weight, height and BMI have been recorded in the chart.  I have made referrals, counseling, and provided education to the patient based on review of the above and I have provided the patient with a written personalized care plan for preventive services.

## 2018-12-03 ENCOUNTER — Ambulatory Visit (INDEPENDENT_AMBULATORY_CARE_PROVIDER_SITE_OTHER): Payer: Medicare Other | Admitting: Family Medicine

## 2018-12-03 ENCOUNTER — Encounter: Payer: Self-pay | Admitting: Family Medicine

## 2018-12-03 VITALS — BP 170/80 | HR 80 | Ht 69.5 in | Wt 154.8 lb

## 2018-12-03 DIAGNOSIS — R030 Elevated blood-pressure reading, without diagnosis of hypertension: Secondary | ICD-10-CM

## 2018-12-03 DIAGNOSIS — Z0000 Encounter for general adult medical examination without abnormal findings: Secondary | ICD-10-CM | POA: Diagnosis not present

## 2018-12-03 DIAGNOSIS — R0789 Other chest pain: Secondary | ICD-10-CM

## 2018-12-03 LAB — POCT URINALYSIS DIP (PROADVANTAGE DEVICE)
BILIRUBIN UA: NEGATIVE mg/dL
Bilirubin, UA: NEGATIVE
Blood, UA: NEGATIVE
Glucose, UA: NEGATIVE mg/dL
Leukocytes, UA: NEGATIVE
Nitrite, UA: NEGATIVE
PROTEIN UA: NEGATIVE mg/dL
SPECIFIC GRAVITY, URINE: 1.025
Urobilinogen, Ur: NEGATIVE
pH, UA: 6 (ref 5.0–8.0)

## 2019-08-30 DIAGNOSIS — H40053 Ocular hypertension, bilateral: Secondary | ICD-10-CM | POA: Diagnosis not present

## 2019-09-26 DIAGNOSIS — H26493 Other secondary cataract, bilateral: Secondary | ICD-10-CM | POA: Diagnosis not present

## 2019-10-31 ENCOUNTER — Other Ambulatory Visit: Payer: Self-pay | Admitting: Family Medicine

## 2019-10-31 DIAGNOSIS — Z1231 Encounter for screening mammogram for malignant neoplasm of breast: Secondary | ICD-10-CM

## 2019-11-08 ENCOUNTER — Encounter: Payer: Self-pay | Admitting: Family Medicine

## 2019-11-22 DIAGNOSIS — H40053 Ocular hypertension, bilateral: Secondary | ICD-10-CM | POA: Diagnosis not present

## 2019-12-03 ENCOUNTER — Ambulatory Visit
Admission: RE | Admit: 2019-12-03 | Discharge: 2019-12-03 | Disposition: A | Discharge status: Home / Self Care | Payer: Medicare Other | Source: Ambulatory Visit | Attending: Family Medicine | Admitting: Family Medicine

## 2019-12-03 ENCOUNTER — Other Ambulatory Visit: Payer: Self-pay

## 2019-12-03 DIAGNOSIS — Z1231 Encounter for screening mammogram for malignant neoplasm of breast: Secondary | ICD-10-CM

## 2019-12-04 NOTE — Progress Notes (Signed)
Chief Complaint  Patient presents with  . Medicare Wellness    fasting AWV/CPE with pelvic-no concerns.     Sheryl Baker is a 74 y.o. female who presents for annual physical exam and Medicare wellness visit.  H/o elevated cholesterol in the past, improved with diet. Last checked 3 years ago, and was good.  Denies change in diet, red meat once a week or less.  Lab Results  Component Value Date   CHOL 190 11/24/2016   HDL 75 11/24/2016   LDLCALC 96 11/24/2016   TRIG 95 11/24/2016   CHOLHDL 2.5 11/24/2016   BP elevated at last visit.  Has blood pressure checked when she donates blood and was 140/72 at blood drive earlier this week.  On home monitor she has seen 120's-130's/70's.   Immunization History  Administered Date(s) Administered  . Influenza Split 07/01/2009, 07/04/2012, 07/05/2013  . Influenza Whole 07/28/2010, 06/22/2011  . Influenza, High Dose Seasonal PF 07/16/2014, 06/29/2016, 07/21/2017, 07/26/2018, 07/12/2019  . Pneumococcal Conjugate-13 04/15/2015  . Pneumococcal Polysaccharide-23 02/17/2010  . Td 07/25/2005  . Tdap 06/22/2011  . Zoster 08/26/2011  . Zoster Recombinat (Shingrix) 11/08/2018, 01/17/2019   Last Pap smear:03/2015 normal; no high risk HPV detected Last mammogram:11/2019 Last colonoscopy: 03/2010 Dr.Edwards-mild int hemorrhoids, diverticulosis sigmoid colon, 24m polyp proximal ascending colon, 257mpolyp rectum. Path benign (not adenomatous) Last DEXA:04/2015, normal. Dentist: every 6 months Ophtho: yearly Exercise: Walks the dog and walks with friends, 3x/week (up to 2 hours on the weekends, 45 minutes during the week).  Yardwork dailyseasonally(mowing, moving mulch)--raking and moving mulch recently.  Has some weights for wrists/ankles, not using recently. Vitamin D-OH screen normal (52) in 2012  Annual Wellness Visit: Other doctors involved in patient's care:  Ophtho: Dr. ScNicki ReaperDr DiArlina Robesid cataract surgery) Dentist: Dr. NoMariea ClontsGI:  Dr. EdOletta Lamaserm: Dr. NoTeressa SenterDepression screen negative Functional Status survey--unremarkable Fall screen: Negative Mini-Cog screen: normal See full screens in epic.  End of Life Care: She has healthcare power of attorney and living will; copies are scanned in chart (07/2017)  She donates blood regularly, last time being earlier this week.  PMH, PSH, SH and FH reviewed and updated.  Outpatient Encounter Medications as of 12/05/2019  Medication Sig Note  . Calcium Carbonate-Vitamin D (CALCIUM 600+D) 600-400 MG-UNIT per tablet Take 1 tablet by mouth daily.     . ferrous sulfate 325 (65 FE) MG tablet Take 325 mg by mouth daily with breakfast. 04/15/2015: Takes occasionally, usually prior to donating blood  . Glucosamine-Chondroit-Vit C-Mn (GLUCOSAMINE CHONDR 1500 COMPLX PO) Take 1 tablet by mouth daily.     . Multiple Vitamins-Minerals (MULTIVITAMIN WITH MINERALS) tablet Take 1 tablet by mouth daily.     . Omega-3 Fatty Acids (FISH OIL) 1000 MG CAPS Take 1 capsule by mouth daily.     . vitamin C (ASCORBIC ACID) 500 MG tablet Take 500 mg by mouth daily.      No facility-administered encounter medications on file as of 12/05/2019.   Allergies  Allergen Reactions  . Codeine Nausea And Vomiting    ROS: The patient denies anorexia, fever, headaches, vision changes, decreased hearing, ear pain, sore throat, breast concerns, chest pain, palpitations, dizziness, syncope, dyspnea on exertion, cough, swelling, nausea, vomiting, diarrhea, constipation, abdominal pain, melena, hematochezia, indigestion/heartburn, hematuria, incontinence, dysuria, vaginal bleeding/spotting, discharge, odor or itch, genital lesions, joint pains, numbness, tingling, weakness, tremor, suspicious skin lesions, depression, anxiety, abnormal bleeding/bruising, or enlarged lymph nodes. Some hand/finger pain/arthritis (sore after a lot of  yardwork; denies morning stiffness). +weight gain (8# in the last  year)   PHYSICAL EXAM:  BP (!) 160/90   Pulse 80   Temp (!) 96.9 F (36.1 C) (Other (Comment))   Ht '5\' 9"'$  (1.753 m)   Wt 162 lb 6.4 oz (73.7 kg)   BMI 23.98 kg/m   Wt Readings from Last 3 Encounters:  12/05/19 162 lb 6.4 oz (73.7 kg)  12/03/18 154 lb 12.8 oz (70.2 kg)  11/29/17 155 lb (70.3 kg)   158/80 on repeat by MD  General Appearance:   Alert, cooperative, no distress, appears stated age  Head:   Normocephalic, without obvious abnormality.   Eyes:   PERRL, conjunctiva/corneas clear, EOM's intact, fundi benign  Ears:   Normal TM's and external ear canals  Nose:  Not examined, wearing mask due to COVID-19 pandemic  Throat:  Not examined, wearing mask due to COVID-19 pandemic  Neck:  Supple, no lymphadenopathy; thyroid: no enlargement/tenderness/ nodules; no carotid bruit or JVD  Back:  Spine nontender, no curvature, ROM normal, no CVA tenderness  Lungs:   Clear to auscultation bilaterally without wheezes, rales or ronchi; respirations unlabored  Chest Wall:  Nontender, no mass  Heart:   Regular rate and rhythm, S1 and S2 normal, no murmur, rub or gallop  Breast Exam:   No tenderness, masses, or nipple discharge or inversion. No axillary lymphadenopathy. WHSS L upper breast, smaller.  Abdomen:   Soft, non-tender, nondistended, normoactive bowel sounds,  no masses, no hepatosplenomegaly  Genitalia:   Normal external genitalia without lesions. BUS and vagina normal; no cervical motion tenderness; bimanual exam normal--uterus and adnexa not enlarged, nontender, no masses. Pap notperformed  Rectal:   Normal tone, no masses or tenderness; guaiac negative stool  Extremities:  No clubbing, cyanosis or edema.R index fingerwithbony deformity at DIP c/w OA.   Pulses:  2+ and symmetric all extremities  Skin:  Skin color, texture, turgor normal, no rashes. See below for description of atypical mole on back.  Lymph nodes:   Cervical, supraclavicular, and axillary nodes normal  Neurologic:  Normal strength, sensation and gait; reflexes 2+ and symmetric throughout    Psych: Normal mood, affect, hygiene and grooming  4x5 mm atypical mole on her upper back, to the right of midline.  Area at the top/left of the mole has some atypical discoloration..   ASSESSMENT/PLAN:  Annual physical exam - Plan: Lipid panel, Comprehensive metabolic panel, CBC with Differential/Platelet  Medicare annual wellness visit, subsequent  Colon cancer screening - prefers Cologuard rather than colonoscopy.  Due in June - Plan: Cologuard  Elevated blood pressure reading without diagnosis of hypertension - counseled re: low Na diet. To monitor BP at home, and bring list and monitor to visit to verify accuracy - Plan: Comprehensive metabolic panel  Blood donor, whole blood - Plan: CBC with Differential/Platelet  Vaccine counseling - discussed risks, SE and how/where to try and schedule for a COVID vaccine  Atypical nevus - will return for punch biopsy   Punch biopsy mole on upper back.  Discussed that entire lesion doesn't need to be removed, will biopsy the atypically colored area (superomedial aspect of mole).  c-met, lipid, cbc (just donated blood Monday, Hg might be lower)   Discussed monthly self breast exams and yearly mammograms (due now); at least 30 minutes of aerobic activity at least 5 days/week, weight-bearing exercise at least 2 days/week; proper sunscreen use reviewed; healthy diet, including goals of calcium and vitamin D intake and alcohol recommendations (  less than or equal to 1 drink/day) reviewed; regular seatbelt use; changing batteries in smoke detectors. Immunization recommendations discussed, UTD;continue yearly high dose flu shots. COVID vaccine recommended. Counseled on SE and how/where to get. Tetanus booster next year, from pharmacy.Colonoscopy recommendations reviewed--screening is due in  June.  Discussed colonoscopy vs Cologuard, kprefers Cologuard testing.  Pap not needed due to age, low risk.  MOST form updated and reviewed.  Full Code, Full care  F/u for punch biopsy, will bring BP monitor and list of BP's to visit. CPE 1 year   Medicare Attestation I have personally reviewed: The patient's medical and social history Their use of alcohol, tobacco or illicit drugs Their current medications and supplements The patient's functional ability including ADLs,fall risks, home safety risks, cognitive, and hearing and visual impairment Diet and physical activities Evidence for depression or mood disorders  The patient's weight, height, BMI have been recorded in the chart.  I have made referrals, counseling, and provided education to the patient based on review of the above and I have provided the patient with a written personalized care plan for preventive services.

## 2019-12-04 NOTE — Patient Instructions (Addendum)
HEALTH MAINTENANCE RECOMMENDATIONS:  It is recommended that you get at least 30 minutes of aerobic exercise at least 5 days/week (for weight loss, you may need as much as 60-90 minutes). This can be any activity that gets your heart rate up. This can be divided in 10-15 minute intervals if needed, but try and build up your endurance at least once a week.  Weight bearing exercise is also recommended twice weekly.  Eat a healthy diet with lots of vegetables, fruits and fiber.  "Colorful" foods have a lot of vitamins (ie green vegetables, tomatoes, red peppers, etc).  Limit sweet tea, regular sodas and alcoholic beverages, all of which has a lot of calories and sugar.  Up to 1 alcoholic drink daily may be beneficial for women (unless trying to lose weight, watch sugars).  Drink a lot of water.  Calcium recommendations are 1200-1500 mg daily (1500 mg for postmenopausal women or women without ovaries), and vitamin D 1000 IU daily.  This should be obtained from diet and/or supplements (vitamins), and calcium should not be taken all at once, but in divided doses.  Monthly self breast exams and yearly mammograms for women over the age of 58 is recommended.  Sunscreen of at least SPF 30 should be used on all sun-exposed parts of the skin when outside between the hours of 10 am and 4 pm (not just when at beach or pool, but even with exercise, golf, tennis, and yard work!)  Use a sunscreen that says "broad spectrum" so it covers both UVA and UVB rays, and make sure to reapply every 1-2 hours.  Remember to change the batteries in your smoke detectors when changing your clock times in the spring and fall. Carbon monoxide detectors are recommended for your home.  Use your seat belt every time you are in a car, and please drive safely and not be distracted with cell phones and texting while driving.   Ms. Hoskins , Thank you for taking time to come for your Medicare Wellness Visit. I appreciate your ongoing  commitment to your health goals. Please review the following plan we discussed and let me know if I can assist you in the future.    This is a list of the screening recommended for you and due dates:  Health Maintenance  Topic Date Due  . Colon Cancer Screening  04/15/2020  . Tetanus Vaccine  06/21/2021  . Flu Shot  Completed  . DEXA scan (bone density measurement)  Completed  .  Hepatitis C: One time screening is recommended by Center for Disease Control  (CDC) for  adults born from [birth year] through 1966.   Completed  . Pneumonia vaccines  Completed   We discussed colonoscopy vs Cologuard as screening for colon cancer. We will put in the referral for Cologuard.  Continue yearly mammogams.  COVID vaccine is recommended.   You should look at [email or web address] for appointments, or through [facility].  [facility] will also start vaccinating soon (follow this news--they will let you know how, I think you need to register on their website). COVID-19 Vaccine Information can be found at: ShippingScam.co.uk For questions related to vaccine distribution or appointments, please email [email or web address] or call [phone].    Please check your blood pressure regularly at home (or elsewhere) and document on the sheet provided (including comments, to help determine patterns--is it the exercise that keeps it down, vs changes in diet that might raise BP, etc). Bring this list and your blood pressure monitor to your  follow-up visit.   DASH Eating Plan DASH stands for "Dietary Approaches to Stop Hypertension." The DASH eating plan is a healthy eating plan that has been shown to reduce high blood pressure (hypertension). It may also reduce your risk for type 2 diabetes, heart disease, and stroke. The DASH eating plan may also help with weight loss. What are tips for following this plan?  General guidelines  Avoid eating more than 2,300 mg  (milligrams) of salt (sodium) a day. If you have hypertension, you may need to reduce your sodium intake to 1,500 mg a day.  Limit alcohol intake to no more than 1 drink a day for nonpregnant women and 2 drinks a day for men. One drink equals 12 oz of beer, 5 oz of wine, or 1 oz of hard liquor.  Work with your health care provider to maintain a healthy body weight or to lose weight. Ask what an ideal weight is for you.  Get at least 30 minutes of exercise that causes your heart to beat faster (aerobic exercise) most days of the week. Activities may include walking, swimming, or biking.  Work with your health care provider or diet and nutrition specialist (dietitian) to adjust your eating plan to your individual calorie needs. Reading food labels   Check food labels for the amount of sodium per serving. Choose foods with less than 5 percent of the Daily Value of sodium. Generally, foods with less than 300 mg of sodium per serving fit into this eating plan.  To find whole grains, look for the word "whole" as the first word in the ingredient list. Shopping  Buy products labeled as "low-sodium" or "no salt added."  Buy fresh foods. Avoid canned foods and premade or frozen meals. Cooking  Avoid adding salt when cooking. Use salt-free seasonings or herbs instead of table salt or sea salt. Check with your health care provider or pharmacist before using salt substitutes.  Do not fry foods. Cook foods using healthy methods such as baking, boiling, grilling, and broiling instead.  Cook with heart-healthy oils, such as olive, canola, soybean, or sunflower oil. Meal planning  Eat a balanced diet that includes: ? 5 or more servings of fruits and vegetables each day. At each meal, try to fill half of your plate with fruits and vegetables. ? Up to 6-8 servings of whole grains each day. ? Less than 6 oz of lean meat, poultry, or fish each day. A 3-oz serving of meat is about the same size as a deck  of cards. One egg equals 1 oz. ? 2 servings of low-fat dairy each day. ? A serving of nuts, seeds, or beans 5 times each week. ? Heart-healthy fats. Healthy fats called Omega-3 fatty acids are found in foods such as flaxseeds and coldwater fish, like sardines, salmon, and mackerel.  Limit how much you eat of the following: ? Canned or prepackaged foods. ? Food that is high in trans fat, such as fried foods. ? Food that is high in saturated fat, such as fatty meat. ? Sweets, desserts, sugary drinks, and other foods with added sugar. ? Full-fat dairy products.  Do not salt foods before eating.  Try to eat at least 2 vegetarian meals each week.  Eat more home-cooked food and less restaurant, buffet, and fast food.  When eating at a restaurant, ask that your food be prepared with less salt or no salt, if possible. What foods are recommended? The items listed may not be a complete list.  Talk with your dietitian about what dietary choices are best for you. Grains Whole-grain or whole-wheat bread. Whole-grain or whole-wheat pasta. Brown rice. [place]. Bulgur. Whole-grain and low-sodium cereals. Pita bread. Low-fat, low-sodium crackers. Whole-wheat flour tortillas. Vegetables Fresh or frozen vegetables (raw, steamed, roasted, or grilled). Low-sodium or reduced-sodium tomato and vegetable juice. Low-sodium or reduced-sodium tomato sauce and tomato paste. Low-sodium or reduced-sodium canned vegetables. Fruits All fresh, dried, or frozen fruit. Canned fruit in natural juice (without added sugar). Meat and other protein foods Skinless chicken or [place]. Ground chicken or [place]. Pork with fat trimmed off. Fish and seafood. Egg whites. Dried beans, peas, or lentils. Unsalted nuts, nut butters, and seeds. Unsalted canned beans. Lean cuts of beef with fat trimmed off. Low-sodium, lean deli meat. Dairy Low-fat (1%) or fat-free (skim) milk. Fat-free, low-fat, or reduced-fat cheeses. Nonfat,  low-sodium ricotta or cottage cheese. Low-fat or nonfat yogurt. Low-fat, low-sodium cheese. Fats and oils Soft margarine without trans fats. Vegetable oil. Low-fat, reduced-fat, or light mayonnaise and salad dressings (reduced-sodium). Canola, safflower, olive, soybean, and sunflower oils. Avocado. Seasoning and other foods Herbs. Spices. Seasoning mixes without salt. Unsalted popcorn and pretzels. Fat-free sweets. What foods are not recommended? The items listed may not be a complete list. Talk with your dietitian about what dietary choices are best for you. Grains Baked goods made with fat, such as croissants, muffins, or some breads. Dry pasta or rice meal packs. Vegetables Creamed or fried vegetables. Vegetables in a cheese sauce. Regular canned vegetables (not low-sodium or reduced-sodium). Regular canned tomato sauce and paste (not low-sodium or reduced-sodium). Regular tomato and vegetable juice (not low-sodium or reduced-sodium). Angie Fava. Olives. Fruits Canned fruit in a light or heavy syrup. Fried fruit. Fruit in cream or butter sauce. Meat and other protein foods Fatty cuts of meat. Ribs. Fried meat. Berniece Salines. Sausage. Bologna and other processed lunch meats. Salami. Fatback. Hotdogs. Bratwurst. Salted nuts and seeds. Canned beans with added salt. Canned or smoked fish. Whole eggs or egg yolks. Chicken or [place] with skin. Dairy Whole or 2% milk, cream, and half-and-half. Whole or full-fat cream cheese. Whole-fat or sweetened yogurt. Full-fat cheese. Nondairy creamers. Whipped toppings. Processed cheese and cheese spreads. Fats and oils Butter. Stick margarine. Lard. Shortening. Ghee. Bacon fat. Tropical oils, such as coconut, palm kernel, or palm oil. Seasoning and other foods Salted popcorn and pretzels. Onion salt, garlic salt, seasoned salt, table salt, and sea salt. Worcestershire sauce. Tartar sauce. Barbecue sauce. Teriyaki sauce. Soy sauce, including reduced-sodium. Steak sauce.  Canned and packaged gravies. Fish sauce. Oyster sauce. Cocktail sauce. Horseradish that you find on the shelf. Ketchup. Mustard. Meat flavorings and tenderizers. Bouillon cubes. Hot sauce and Tabasco sauce. Premade or packaged marinades. Premade or packaged taco seasonings. Relishes. Regular salad dressings. Where to find more information:  National Heart, Lung, and [place]: https://wilson-eaton.com/  American Heart Association: www.heart.org Summary  The DASH eating plan is a healthy eating plan that has been shown to reduce high blood pressure (hypertension). It may also reduce your risk for type 2 diabetes, heart disease, and stroke.  With the DASH eating plan, you should limit salt (sodium) intake to 2,300 mg a day. If you have hypertension, you may need to reduce your sodium intake to 1,500 mg a day.  When on the DASH eating plan, aim to eat more fresh fruits and vegetables, whole grains, lean proteins, low-fat dairy, and heart-healthy fats.  Work with your health care provider or diet and nutrition specialist (dietitian) to adjust  your eating plan to your individual calorie needs. This information is not intended to replace advice given to you by your health care provider. Make sure you discuss any questions you have with your health care provider. Document Revised: 09/22/2017 Document Reviewed: 10/03/2016 Elsevier Patient Education  2020 [employer].

## 2019-12-05 ENCOUNTER — Other Ambulatory Visit: Payer: Self-pay

## 2019-12-05 ENCOUNTER — Ambulatory Visit (INDEPENDENT_AMBULATORY_CARE_PROVIDER_SITE_OTHER): Payer: Medicare Other | Admitting: Family Medicine

## 2019-12-05 ENCOUNTER — Encounter: Payer: Self-pay | Admitting: Family Medicine

## 2019-12-05 VITALS — BP 158/80 | HR 80 | Temp 96.9°F | Ht 69.0 in | Wt 162.4 lb

## 2019-12-05 DIAGNOSIS — Z1211 Encounter for screening for malignant neoplasm of colon: Secondary | ICD-10-CM | POA: Diagnosis not present

## 2019-12-05 DIAGNOSIS — R030 Elevated blood-pressure reading, without diagnosis of hypertension: Secondary | ICD-10-CM | POA: Diagnosis not present

## 2019-12-05 DIAGNOSIS — Z7185 Encounter for immunization safety counseling: Secondary | ICD-10-CM

## 2019-12-05 DIAGNOSIS — Z0000 Encounter for general adult medical examination without abnormal findings: Secondary | ICD-10-CM

## 2019-12-05 DIAGNOSIS — Z7189 Other specified counseling: Secondary | ICD-10-CM

## 2019-12-05 DIAGNOSIS — I10 Essential (primary) hypertension: Secondary | ICD-10-CM | POA: Diagnosis not present

## 2019-12-05 DIAGNOSIS — D229 Melanocytic nevi, unspecified: Secondary | ICD-10-CM

## 2019-12-05 DIAGNOSIS — Z52000 Unspecified donor, whole blood: Secondary | ICD-10-CM | POA: Diagnosis not present

## 2019-12-05 DIAGNOSIS — E7800 Pure hypercholesterolemia, unspecified: Secondary | ICD-10-CM | POA: Diagnosis not present

## 2019-12-06 LAB — CBC WITH DIFFERENTIAL/PLATELET
Basophils Absolute: 0.1 10*3/uL (ref 0.0–0.2)
Basos: 1 %
EOS (ABSOLUTE): 0.1 10*3/uL (ref 0.0–0.4)
Eos: 2 %
Hematocrit: 36.6 % (ref 34.0–46.6)
Hemoglobin: 12 g/dL (ref 11.1–15.9)
Immature Grans (Abs): 0 10*3/uL (ref 0.0–0.1)
Immature Granulocytes: 0 %
Lymphocytes Absolute: 2.4 10*3/uL (ref 0.7–3.1)
Lymphs: 44 %
MCH: 29.1 pg (ref 26.6–33.0)
MCHC: 32.8 g/dL (ref 31.5–35.7)
MCV: 89 fL (ref 79–97)
Monocytes Absolute: 0.6 10*3/uL (ref 0.1–0.9)
Monocytes: 11 %
Neutrophils Absolute: 2.3 10*3/uL (ref 1.4–7.0)
Neutrophils: 42 %
Platelets: 254 10*3/uL (ref 150–450)
RBC: 4.12 x10E6/uL (ref 3.77–5.28)
RDW: 14.3 % (ref 11.7–15.4)
WBC: 5.5 10*3/uL (ref 3.4–10.8)

## 2019-12-06 LAB — LIPID PANEL
Chol/HDL Ratio: 3 ratio (ref 0.0–4.4)
Cholesterol, Total: 220 mg/dL — ABNORMAL HIGH (ref 100–199)
HDL: 73 mg/dL (ref >39)
LDL Chol Calc (NIH): 128 mg/dL — ABNORMAL HIGH (ref 0–99)
Triglycerides: 111 mg/dL (ref 0–149)
VLDL Cholesterol Cal: 19 mg/dL (ref 5–40)

## 2019-12-06 LAB — COMPREHENSIVE METABOLIC PANEL
ALT: 26 IU/L (ref 0–32)
AST: 37 IU/L (ref 0–40)
Albumin/Globulin Ratio: 2 (ref 1.2–2.2)
Albumin: 4.5 g/dL (ref 3.7–4.7)
Alkaline Phosphatase: 115 IU/L (ref 39–117)
BUN/Creatinine Ratio: 20 (ref 12–28)
BUN: 12 mg/dL (ref 8–27)
Bilirubin Total: 0.5 mg/dL (ref 0.0–1.2)
CO2: 22 mmol/L (ref 20–29)
Calcium: 9.5 mg/dL (ref 8.7–10.3)
Chloride: 103 mmol/L (ref 96–106)
Creatinine, Ser: 0.6 mg/dL (ref 0.57–1.00)
GFR calc Af Amer: 103 mL/min/{1.73_m2} (ref >59)
GFR calc non Af Amer: 89 mL/min/{1.73_m2} (ref >59)
Globulin, Total: 2.3 g/dL (ref 1.5–4.5)
Glucose: 103 mg/dL — ABNORMAL HIGH (ref 65–99)
Potassium: 4.2 mmol/L (ref 3.5–5.2)
Sodium: 141 mmol/L (ref 134–144)
Total Protein: 6.8 g/dL (ref 6.0–8.5)

## 2019-12-14 ENCOUNTER — Ambulatory Visit: Payer: Medicare Other | Attending: Internal Medicine

## 2019-12-14 DIAGNOSIS — Z23 Encounter for immunization: Secondary | ICD-10-CM

## 2019-12-14 NOTE — Progress Notes (Signed)
   Covid-19 Vaccination Clinic  Name:  Sheryl Baker    MRN: [id number] DOB: 02/09/1946  12/14/2019  [name] was observed post Covid-19 immunization for 15 minutes without incidence. She was provided with Vaccine Information Sheet and instruction to access the V-Safe system.   [name] was instructed to call 911 with any severe reactions post vaccine: Marland Kitchen Difficulty breathing  . Swelling of your face and throat  . A fast heartbeat  . A bad rash all over your body  . Dizziness and weakness    Immunizations Administered    Name Date Dose VIS Date Route   Pfizer COVID-19 Vaccine 12/14/2019  8:08 AM 0.3 mL 10/04/2019 Intramuscular   Manufacturer: [place]   Lot: [id number]   [place]: [id number]

## 2019-12-19 DIAGNOSIS — Z1211 Encounter for screening for malignant neoplasm of colon: Secondary | ICD-10-CM | POA: Diagnosis not present

## 2019-12-23 DIAGNOSIS — I10 Essential (primary) hypertension: Secondary | ICD-10-CM

## 2019-12-23 HISTORY — DX: Essential (primary) hypertension: I10

## 2019-12-24 LAB — COLOGUARD
COLOGUARD: NEGATIVE
Cologuard: NEGATIVE

## 2019-12-30 ENCOUNTER — Encounter: Payer: Self-pay | Admitting: *Deleted

## 2020-01-06 ENCOUNTER — Ambulatory Visit: Payer: Medicare Other | Attending: Internal Medicine

## 2020-01-06 DIAGNOSIS — Z23 Encounter for immunization: Secondary | ICD-10-CM

## 2020-01-06 NOTE — Progress Notes (Signed)
   Covid-19 Vaccination Clinic  Name:  Sheryl Baker    MRN: [id number] DOB: June 07, 1946  01/06/2020  [name] was observed post Covid-19 immunization for 15 minutes without incident. She was provided with Vaccine Information Sheet and instruction to access the V-Safe system.   [name] was instructed to call 911 with any severe reactions post vaccine: Marland Kitchen Difficulty breathing  . Swelling of face and throat  . A fast heartbeat  . A bad rash all over body  . Dizziness and weakness   Immunizations Administered    Name Date Dose VIS Date Route   Pfizer COVID-19 Vaccine 01/06/2020  8:17 AM 0.3 mL 10/04/2019 Intramuscular   Manufacturer: [place]   Lot: [id number]   [place]: [id number]

## 2020-01-15 NOTE — Progress Notes (Signed)
Chief Complaint  Patient presents with  . Follow-up    on bp and mole biopsy on back. No new concerns.     Patient presents for biopsy of mole on her upper back, and follow up on blood pressure.  At her recent physical, her BP was noted to be elevated at 158/80 (had also been elevated the year prior).  She reported that she has blood pressure checked when she donated blood and was 140/72 at blood drive prior to her last visit.  She had reported that on her home monitor she has seen 120's-130's/70's. That monitor hadn't been verified/check to see if accurate.  She had been advised to follow a low Na diet, to monitor BP at home, and bring list and monitor to visit to verify accuracy, which she has done today.  BP Readings from Last 3 Encounters:  12/05/19 (!) 158/80  12/03/18 (!) 170/80  11/29/17 128/82   She is following a low sodium diet, cut back on canned soups, reading labels.  BP's at home running 136-167/71-78 Evenings--one value of 116/70 (at bedtime), once 128/66 (also late, 9:30pm). Other afternoon/evening bp's 143-159/65-79 Pulse 60-82  PMH, PSH, SH reviewed  Outpatient Encounter Medications as of 01/16/2020  Medication Sig Note  . Calcium Carbonate-Vitamin D (CALCIUM 600+D) 600-400 MG-UNIT per tablet Take 1 tablet by mouth daily.     . Glucosamine-Chondroit-Vit C-Mn (GLUCOSAMINE CHONDR 1500 COMPLX PO) Take 1 tablet by mouth daily.     . Multiple Vitamins-Minerals (MULTIVITAMIN WITH MINERALS) tablet Take 1 tablet by mouth daily.     . Omega-3 Fatty Acids (FISH OIL) 1000 MG CAPS Take 1 capsule by mouth daily.     . vitamin C (ASCORBIC ACID) 500 MG tablet Take 500 mg by mouth daily.     . ferrous sulfate 325 (65 FE) MG tablet Take 325 mg by mouth daily with breakfast. 04/15/2015: Takes occasionally, usually prior to donating blood  . hydrochlorothiazide (HYDRODIURIL) 25 MG tablet Start at 1/2 tablet by mouth once daily. Increase to full tablet after 1-2 weeks if BP is still over  [id number] systolic    No facility-administered encounter medications on file as of 01/16/2020.   (NOT taking HCTZ prior to today's visit)  Allergies  Allergen Reactions  . Codeine Nausea And Vomiting   ROS: no fever, chills, URI symptoms, headaches, dizziness, chest pain, shortness of breath, edema.  No bleeding, bruising or other concerns.   PHYSICAL EXAM:  BP (!) 170/76   Pulse 80   Temp (!) 96.8 F (36 C) (Other (Comment))   Ht 5\' 9"  (1.753 m)   Wt 157 lb 9.6 oz (71.5 kg)   BMI 23.27 kg/m   BP on pt's monitor was 167/71  Well-appearing, pleasant female, in no distress. Skin:  4x5 mm atypical mole on her upper back, to the right of midline.  Area at the top/left of the mole has some atypical discoloration..  Consent: Verbal consent was obtained from the patient for excisional biopsy. She understands the risks to be bleeding, infection and scar, and understands the alternatives. She wishes to proceed with biopsy today.  Procedure: Area was cleansed with betadine, anesthetized with 1% lidocaine with epi 15 blade was used to make elliptical incision. Wound edges were closed with 4-0 nylon x 3 interrupted sutures Bacitracin and bandage was applied. Patient tolerated the procedure without complications.  EKG NSR rate 67. No abnormal findings   ASSESSMENT/PLAN:  Essential hypertension - home monitor accurate.  Start HCTZ 12.5mg , increase after  1-2 wks to 25mg  if BP remains above goal. - Plan: hydrochlorothiazide (HYDRODIURIL) 25 MG tablet, EKG 12-Lead  Pigmented skin lesion of uncertain nature - exisional biopsy performed. suture wound care reviewed, as well as s/sx of infection.  return for suture removal in 10d - Plan: Dermatology pathology, PR EXC SKIN BENIG <0.5 CM TRUNK,ARM,LEG  Start taking 1/2 tablet of the HCTZ once daily in the morning. Continue low sodium diet, and be sure to stay well hydrated. Be sure to use sunscreen when outside (risk of sun sensitivity on this  medication).  Monitor your blood pressure. If after 1-2 weeks of the 12.5mg  dose (1/2 pill) your blood pressure is consistently over [id number] systolic, then increase to the full 25mg  tablet daily. Be sure to get some dietary potassium daily (especially on the full pill)--1/2 - 1 banana daily.   Return in 4-6 weeks with list of blood pressures. Feel free to contact us sooner with your values, especially if you aren't sure about whether you should increase the dose.

## 2020-01-16 ENCOUNTER — Ambulatory Visit (INDEPENDENT_AMBULATORY_CARE_PROVIDER_SITE_OTHER): Payer: Medicare Other | Admitting: Family Medicine

## 2020-01-16 ENCOUNTER — Other Ambulatory Visit: Payer: Self-pay

## 2020-01-16 ENCOUNTER — Encounter: Payer: Self-pay | Admitting: Family Medicine

## 2020-01-16 VITALS — BP 170/76 | HR 80 | Temp 96.8°F | Ht 69.0 in | Wt 157.6 lb

## 2020-01-16 DIAGNOSIS — L819 Disorder of pigmentation, unspecified: Secondary | ICD-10-CM

## 2020-01-16 DIAGNOSIS — D235 Other benign neoplasm of skin of trunk: Secondary | ICD-10-CM

## 2020-01-16 DIAGNOSIS — I10 Essential (primary) hypertension: Secondary | ICD-10-CM | POA: Diagnosis not present

## 2020-01-16 DIAGNOSIS — D225 Melanocytic nevi of trunk: Secondary | ICD-10-CM | POA: Diagnosis not present

## 2020-01-16 MED ORDER — HYDROCHLOROTHIAZIDE 25 MG PO TABS: ORAL_TABLET | ORAL | 1 refills | Status: DC

## 2020-01-16 NOTE — Patient Instructions (Addendum)
Start taking 1/2 tablet of the HCTZ once daily in the morning. Continue low sodium diet, and be sure to stay well hydrated. Be sure to use sunscreen when outside (risk of sun sensitivity on this medication).  Monitor your blood pressure and record. If you get a high value, feel free to relax and recheck in 10 minutes. If after 1-2 weeks of the 12.5mg  dose (1/2 pill) your blood pressure is consistently over [id number] systolic, then increase to the full 25mg  tablet daily. Be sure to get some dietary potassium daily (especially on the full pill)--1/2 - 1 banana daily.   Return in 4-6 weeks with list of blood pressures. Feel free to contact us sooner with your values, especially if you aren't sure about whether you should increase the dose.   Sutured Wound Care Sutures are stitches that can be used to close wounds. Taking care of your wound properly can help prevent pain and infection. It can also help your wound to heal more quickly. Follow instructions from your doctor about how to care for your sutured wound. Supplies needed:  Soap and water.  A clean bandage (dressing), if needed.  Antibiotic ointment.  A clean towel. How to care for your sutured wound   Keep the wound completely dry for the first 24 hours or as long as told by your doctor. After 24-48 hours, you may shower or bathe as told by your doctor. Do not soak the wound or put the wound completely under water until the sutures have been removed.  After the first 24 hours, clean the wound once a day, or as often as your doctor tells you to. Take these steps: ? Wash the wound with soap and water. ? Rinse the wound with water. Make sure to wash all the soap off. ? Pat the wound dry with a clean towel. Do not rub the wound.  After cleaning the wound, put a thin layer of antibiotic ointment on the wound as told by your doctor. This will help: ? Prevent infection. ? Keep the bandage from sticking to the wound.  Follow instructions  from your doctor about how to change your bandage: ? Wash your hands with soap and water. If you cannot use soap and water, use hand sanitizer. ? Change your bandage at least once a day, or as often as told by your doctor. If your dressing gets wet or dirty, change it. ? Leave sutures, skin glue, or skin tape (adhesive) strips in place. They may need to stay in place for 2 weeks or longer. If tape strips get loose and curl up, you may trim the loose edges. Do not remove tape strips completely unless your doctor says it is okay.  Check your wound every day for signs of infection. Watch for: ? Redness, swelling, or pain. ? Fluid or blood. ? Warmth. ? Pus or a bad smell.  Have the sutures removed as told by your doctor. Follow these instructions at home: Medicines  Take or apply over-the-counter and prescription medicines only as told by your doctor.  If you were prescribed an antibiotic medicine or ointment, take or apply it as told by your doctor. Do not stop using the antibiotic even if you start to feel better. General instructions  Cover your wound with clothes or put sunscreen on when you are outside. Use a sunscreen of at least 30 SPF.  Do not scratch or pick at your wound.  Avoid stretching your wound.  Raise (elevate) the injured area  above the level of your heart while you are sitting or lying down, if possible.  Drink enough fluids to keep your pee (urine) clear or pale yellow.  Keep all follow-up visits as told by your doctor. This is important. Contact a doctor if:  You were given a tetanus shot and you have any of the following at the site where the needle went in: ? Swelling. ? Very bad pain. ? Redness. ? Bleeding.  Your wound breaks open.  You have redness, swelling, or pain around your wound.  You have fluid or blood coming from your wound.  Your wound feels warm to the touch.  You have a fever.  You notice something coming out of your wound, such as  wood or glass.  You have pain that does not get better with medicine.  The skin near your wound changes color.  You need to change your bandage often due to a lot of fluid, blood, or pus coming from the wound.  You get a new rash.  You get numbness around the wound. Get help right away if:  You have very bad swelling around your wound.  You have pus or a bad smell coming from your wound.  Your pain suddenly gets worse and is very bad.  You have painful lumps near your wound or anywhere on your body.  You have a red streak going away from your wound.  The wound is on your hand or foot, and: ? You cannot move a finger or toe as you used to do. ? Your fingers or toes look pale or blue. ? You have numbness that spreads down your hand, foot, fingers, or toes. Summary  Sutures are stitches that are used to close wounds.  Taking care of your wound properly can help prevent pain and infection.  Keep the wound completely dry for the first 24 hours or for as long as told by your doctor. After 24-48 hours, you may shower or bathe as directed by your doctor. This information is not intended to replace advice given to you by your health care provider. Make sure you discuss any questions you have with your health care provider. Document Revised: 09/22/2017 Document Reviewed: 11/15/2016 Elsevier Patient Education  2020 [employer].

## 2020-01-26 NOTE — Progress Notes (Signed)
Chief Complaint  Patient presents with  . Suture / Staple Removal    suture removal and bp recheck. She brought in letter from [place] to show you that is due for colonoscopy-she told them she had Cologuard, they were fine with that. Just wanted to let you know. Also brought in list of bp readings.   . Rash    was moving some plants in the yard on 3/23 and rash/blisters popped up 3/25 after her visit here. Blister burn and she would like to know if you can take a look for her.     She had been doing yard work prior to her last visit. She first noticed the rash a few days after (after her physical).  The rash is very itchy at night, finds herself waking up scratching. She noticed some on her face in the last week. She thinks the ones on her R arm may have been bug bites, but the rest was direct contact with a viny plant she was digging up. Has rash on both arms, chest, and now some starting on her face.  Patient presents for suture removal.  She had skin lesion removed from her upper back. She has not had any problems or complications related to the biopsy site. Pathology showed dysplastic compound nevus with moderate atypia (margins clear).  She is also here to f/u on her blood pressure.  HCTZ 12.5mg  was started at last visit. She is tolerating medication without side effects. BP has been running 119-134/60-70. P 65-81 on the 12.5mg  dose. On 3/31 had seen 148/73, recheck later that night was 111/60.  BP monitor was verified as accurate at her last visit.  PMH, PSH, SH reviewed  Outpatient Encounter Medications as of 01/27/2020  Medication Sig Note  . Calcium Carbonate-Vitamin D (CALCIUM 600+D) 600-400 MG-UNIT per tablet Take 1 tablet by mouth daily.     . Glucosamine-Chondroit-Vit C-Mn (GLUCOSAMINE CHONDR 1500 COMPLX PO) Take 1 tablet by mouth daily.     . hydrochlorothiazide (HYDRODIURIL) 25 MG tablet Start at 1/2 tablet by mouth once daily. Increase to full tablet after 1-2 weeks if BP is  still over [id number] systolic [id number]: Taking 1/2 tab  . Multiple Vitamins-Minerals (MULTIVITAMIN WITH MINERALS) tablet Take 1 tablet by mouth daily.     . Omega-3 Fatty Acids (FISH OIL) 1000 MG CAPS Take 1 capsule by mouth daily.     . vitamin C (ASCORBIC ACID) 500 MG tablet Take 500 mg by mouth daily.     . ferrous sulfate 325 (65 FE) MG tablet Take 325 mg by mouth daily with breakfast. 04/15/2015: Takes occasionally, usually prior to donating blood  . methylPREDNISolone (MEDROL DOSEPAK) 4 MG TBPK tablet Take as directed    No facility-administered encounter medications on file as of 01/27/2020.   Allergies  Allergen Reactions  . Codeine Nausea And Vomiting   ROS: no fever, chills, URI symptoms, shortness of breath, chest pain, headaches, muscle cramps, edema.  +rash per HPI   PHYSICAL EXAM:  BP (!) 170/70   Pulse 80   Temp (!) 97.3 F (36.3 C) (Other (Comment))   Ht 5\' 9"  (1.753 m)   Wt 156 lb 12.8 oz (71.1 kg)   BMI 23.16 kg/m   162/76 on repeat by MD  Well-appearing, pleasant female, in no distress HEENT: conjunctiva and sclera are clear, EOMI. A few scattered papules on her cheeks bilaterally. Heart: regular rate and rhythm, no murmur Lungs: clear Skin: Linear excoriated lesions on L forearm Scattered  papules on the RUE and on fingers, and some linear excoriations higher on arm. Scattered blisters and small papules on L fingers, L elbow Linear excoriations across her chest. Neuro: alert and oriented, normal gait Psych: slightly anxious at first, normal affect, hygiene, grooming.   ASSESSMENT/PLAN:  Essential hypertension - BP elevated, but improved per home numbers and monitor verified as accurate at last visit. Cont 12.5mg  HCTZ  Visit for suture removal - sutures removed without complication. Wound edges healed. 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 Treat with oral steroid course, risks/SE reviewed in detail - Plan: methylPREDNISolone  (MEDROL DOSEPAK) 4 MG TBPK tablet   F/u next month as scheduled on BP

## 2020-01-27 ENCOUNTER — Encounter: Payer: Self-pay | Admitting: Family Medicine

## 2020-01-27 ENCOUNTER — Other Ambulatory Visit: Payer: Self-pay

## 2020-01-27 ENCOUNTER — Ambulatory Visit (INDEPENDENT_AMBULATORY_CARE_PROVIDER_SITE_OTHER): Payer: Medicare Other | Admitting: Family Medicine

## 2020-01-27 VITALS — BP 162/76 | HR 80 | Temp 97.3°F | Ht 69.0 in | Wt 156.8 lb

## 2020-01-27 DIAGNOSIS — Z4802 Encounter for removal of sutures: Secondary | ICD-10-CM | POA: Diagnosis not present

## 2020-01-27 DIAGNOSIS — L255 Unspecified contact dermatitis due to plants, except food: Secondary | ICD-10-CM | POA: Diagnosis not present

## 2020-01-27 DIAGNOSIS — I10 Essential (primary) hypertension: Secondary | ICD-10-CM

## 2020-01-27 MED ORDER — METHYLPREDNISOLONE 4 MG PO TBPK: ORAL_TABLET | ORAL | 0 refills | Status: DC

## 2020-01-27 NOTE — Patient Instructions (Signed)
Your sutures were moved.  Wound has healed well. You may leave it uncovered after today. Be sure to keep covered or use sunscreen when outside.  Blood pressure was elevated in the office, but looks good at home.  Stay on the 1/2 tablet daily. Bring your list again to your visit next month.  Take the steroid tablets as directed for the plant dermatitis. Let us know if you cannot tolerate it or if it doesn't resolve.   Poison Ivy Dermatitis Poison ivy dermatitis is inflammation of the skin that is caused by chemicals in the leaves of the poison ivy plant. The skin reaction often involves redness, swelling, blisters, and extreme itching. What are the causes? This condition is caused by a chemical (urushiol) found in the sap of the poison ivy plant. This chemical is sticky and can be easily spread to people, animals, and objects. You can get poison ivy dermatitis by:  Having direct contact with a poison ivy plant.  Touching animals, other people, or objects that have come in contact with poison ivy and have the chemical on them. What increases the risk? This condition is more likely to develop in people who:  Are outdoors often in wooded or [place] areas.  Go outdoors without wearing protective clothing, such as closed shoes, long pants, and a long-sleeved shirt. What are the signs or symptoms? Symptoms of this condition include:  Redness of the skin.  Extreme itching.  A rash that often includes bumps and blisters. The rash usually appears 48 hours after exposure, if you have been exposed before. If this is the first time you have been exposed, the rash may not appear until a week after exposure.  Swelling. This may occur if the reaction is more severe. Symptoms usually last for 1-2 weeks. However, the first time you develop this condition, symptoms may last 3-4 weeks. How is this diagnosed? This condition may be diagnosed based on your symptoms and a physical exam. Your health care  provider may also ask you about any recent outdoor activity. How is this treated? Treatment for this condition will vary depending on how severe it is. Treatment may include:  Hydrocortisone cream or calamine lotion to relieve itching.  Oatmeal baths to soothe the skin.  Medicines, such as over-the-counter antihistamine tablets.  Oral steroid medicine, for more severe reactions. Follow these instructions at home: Medicines  Take or apply over-the-counter and prescription medicines only as told by your health care provider.  Use hydrocortisone cream or calamine lotion as needed to soothe the skin and relieve itching. General instructions  Do not scratch or rub your skin.  Apply a cold, wet cloth (cold compress) to the affected areas or take baths in cool water. This will help with itching. Avoid hot baths and showers.  Take oatmeal baths as needed. Use colloidal oatmeal. You can get this at your local pharmacy or grocery store. Follow the instructions on the packaging.  While you have the rash, wash clothes right after you wear them.  Keep all follow-up visits as told by your health care provider. This is important. How is this prevented?   Learn to identify the poison ivy plant and avoid contact with the plant. This plant can be recognized by the number of leaves. Generally, poison ivy has three leaves with flowering branches on a single stem. The leaves are typically glossy, and they have jagged edges that come to a point at the front.  If you have been exposed to poison ivy,  thoroughly wash with soap and water right away. You have about 30 minutes to remove the plant resin before it will cause the rash. Be sure to wash under your fingernails, because any plant resin there will continue to spread the rash.  When hiking or camping, wear clothes that will help you to avoid exposure on the skin. This includes long pants, a long-sleeved shirt, tall socks, and hiking boots. You can  also apply preventive lotion to your skin to help limit exposure.  If you suspect that your clothes or outdoor gear came in contact with poison ivy, rinse them off outside with a garden hose before you bring them inside your house.  When doing yard work or gardening, wear gloves, long sleeves, long pants, and boots. Wash your garden tools and gloves if they come in contact with poison ivy.  If you suspect that your pet has come into contact with poison ivy, wash him or her with pet shampoo and water. Make sure to wear gloves while washing your pet. Contact a health care provider if you have:  Open sores in the rash area.  More redness, swelling, or pain in the affected area.  Redness that spreads beyond the rash area.  Fluid, blood, or pus coming from the affected area.  A fever.  A rash over a large area of your body.  A rash on your eyes, mouth, or genitals.  A rash that does not improve after a few weeks. Get help right away if:  Your face swells or your eyes swell shut.  You have trouble breathing.  You have trouble swallowing. These symptoms may represent a serious problem that is an emergency. Do not wait to see if the symptoms will go away. Get medical help right away. Call your local emergency services (911 in the [place] Do not drive yourself to the hospital. Summary  Poison ivy dermatitis is inflammation of the skin that is caused by chemicals in the leaves of the poison ivy plant.  Symptoms of this condition include redness, itching, a rash, and swelling.  Do not scratch or rub your skin.  Take or apply over-the-counter and prescription medicines only as told by your health care provider. This information is not intended to replace advice given to you by your health care provider. Make sure you discuss any questions you have with your health care provider. Document Revised: 02/01/2019 Document Reviewed: 10/05/2018 Elsevier Patient Education  2020 [employer].

## 2020-02-08 ENCOUNTER — Other Ambulatory Visit: Payer: Self-pay | Admitting: Family Medicine

## 2020-02-08 DIAGNOSIS — I10 Essential (primary) hypertension: Secondary | ICD-10-CM

## 2020-02-25 NOTE — Progress Notes (Signed)
Chief Complaint  Patient presents with  . Hypertension    follow on blood pressure. No new concerns.     Patient presents for follow-up on hypertension.  BP was noted to be elevated at her physical in 11/2019, and remained elevated at f/u visit in March. She was started on HCTZ 12.5mg  daily at that time.  When she returned for suture removal (from the biopsy done the day the med was started), she reported tolerating the medication without side effects. She had been instructed to increase to the full pill if BP was running 140 or higher, but she didn't need to.  She continues to take 1/2 tablet daily.  BP's have been running 106-135/58-80, average about 120's/65-70. Her BP monitor had been verified as accurate in March.  She denies headaches, dizziness, chest pain, edema or muscle cramps.  PMH, PSH, SH reviewed  Outpatient Encounter Medications as of 02/27/2020  Medication Sig Note  . Calcium Carbonate-Vitamin D (CALCIUM 600+D) 600-400 MG-UNIT per tablet Take 1 tablet by mouth daily.     . Glucosamine-Chondroit-Vit C-Mn (GLUCOSAMINE CHONDR 1500 COMPLX PO) Take 1 tablet by mouth daily.     . Multiple Vitamins-Minerals (MULTIVITAMIN WITH MINERALS) tablet Take 1 tablet by mouth daily.     . Omega-3 Fatty Acids (FISH OIL) 1000 MG CAPS Take 1 capsule by mouth daily.     . vitamin C (ASCORBIC ACID) 500 MG tablet Take 500 mg by mouth daily.     . [DISCONTINUED] hydrochlorothiazide (HYDRODIURIL) 25 MG tablet Start at 1/2 tablet by mouth once daily. Increase to full tablet after 1-2 weeks if BP is still over [id number] systolic [id number]: Took full today  . ferrous sulfate 325 (65 FE) MG tablet Take 325 mg by mouth daily with breakfast. 04/15/2015: Takes occasionally, usually prior to donating blood  . hydrochlorothiazide (MICROZIDE) 12.5 MG capsule Take 1 capsule (12.5 mg total) by mouth daily.   . [DISCONTINUED] methylPREDNISolone (MEDROL DOSEPAK) 4 MG TBPK tablet Take as directed    No facility-administered  encounter medications on file as of 02/27/2020.   Allergies  Allergen Reactions  . Codeine Nausea And Vomiting   ROS: no fever, chills, URI symptoms, chest pain, palpitations, shortness of breath, edema, muscle cramps, GI complaints, urinary complaints or other concerns. Moods are good.   PHYSICAL EXAM:  BP (!) 170/70   Pulse 80   Temp (!) 97.1 F (36.2 C) (Temporal)   Ht 5\' 9"  (1.753 m)   Wt 152 lb (68.9 kg)   BMI 22.45 kg/m    Wt Readings from Last 3 Encounters:  01/27/20 156 lb 12.8 oz (71.1 kg)  01/16/20 157 lb 9.6 oz (71.5 kg)  12/05/19 162 lb 6.4 oz (73.7 kg)   Well-appearing, pleasant female in no distress HEENT: conjunctiva and sclera are clear, EOMI. Wearing mask Neck; no lymphadenopathy or mass Heart: regular rate and rhythm Lungs: clear bilaterally Extremities: no edema Psych: normal mood, affect, hygiene and grooming Neuro: alert and oriented, normal gait.   ASSESSMENT/PLAN:  Essential hypertension - well controlled per home numbers, with BP monitor that has been verified as accurate. Cont 12.5mg  HCTZ, change to capsule for ease - Plan: Basic metabolic panel, hydrochlorothiazide (MICROZIDE) 12.5 MG capsule  Medication monitoring encounter - Plan: Basic metabolic panel  White coat syndrome with diagnosis of hypertension - BP lower at home (on monitor that was verified as accurate).  Advised to cont to check BP's 2-3x/week, and contact us if high or low. Follow up at CPE in  11/2019, sooner if needed.

## 2020-02-27 ENCOUNTER — Ambulatory Visit (INDEPENDENT_AMBULATORY_CARE_PROVIDER_SITE_OTHER): Payer: Medicare Other | Admitting: Family Medicine

## 2020-02-27 ENCOUNTER — Other Ambulatory Visit: Payer: Self-pay

## 2020-02-27 ENCOUNTER — Encounter: Payer: Self-pay | Admitting: Family Medicine

## 2020-02-27 VITALS — BP 170/70 | HR 80 | Temp 97.1°F | Ht 69.0 in | Wt 152.0 lb

## 2020-02-27 DIAGNOSIS — Z5181 Encounter for therapeutic drug level monitoring: Secondary | ICD-10-CM | POA: Diagnosis not present

## 2020-02-27 DIAGNOSIS — I10 Essential (primary) hypertension: Secondary | ICD-10-CM | POA: Diagnosis not present

## 2020-02-27 MED ORDER — HYDROCHLOROTHIAZIDE 12.5 MG PO CAPS: 12.5000 mg | ORAL_CAPSULE | Freq: Every day | ORAL | 2 refills | Status: DC

## 2020-02-27 NOTE — Patient Instructions (Signed)
Blood pressures at home are very good.  Goal is under 130/80, but occasionally up to 140 is okay.  Given that you have a lot in the teens, I think increasing the dose could make those too low and make you feel worse.  Continue to stay well hydrated, and follow a low sodium diet, and continue to get regular exercise.  Check your blood pressures 2-3 times per week. If you notice that they are persistently above 140/90 or if they are running under [id number] systolic and you're feeling dizzy, please contact us and schedule an appointment. Feel free to send a MyChart message or mail/fax your blood pressure list at any time with concerns.  Otherwise, we will see you for your physical in February. We changed the pill to a lower dose capsule so that you don't need to cut them in half.

## 2020-02-28 LAB — BASIC METABOLIC PANEL
BUN/Creatinine Ratio: 16 (ref 12–28)
BUN: 11 mg/dL (ref 8–27)
CO2: 22 mmol/L (ref 20–29)
Calcium: 10 mg/dL (ref 8.7–10.3)
Chloride: 102 mmol/L (ref 96–106)
Creatinine, Ser: 0.68 mg/dL (ref 0.57–1.00)
GFR calc Af Amer: 99 mL/min/{1.73_m2} (ref >59)
GFR calc non Af Amer: 86 mL/min/{1.73_m2} (ref >59)
Glucose: 92 mg/dL (ref 65–99)
Potassium: 4.1 mmol/L (ref 3.5–5.2)
Sodium: 140 mmol/L (ref 134–144)

## 2020-03-06 ENCOUNTER — Other Ambulatory Visit: Payer: Self-pay | Admitting: Family Medicine

## 2020-03-06 DIAGNOSIS — I10 Essential (primary) hypertension: Secondary | ICD-10-CM

## 2020-08-05 ENCOUNTER — Telehealth: Payer: Self-pay | Admitting: *Deleted

## 2020-08-05 NOTE — Telephone Encounter (Signed)
I am not aware of what this could be.

## 2020-08-05 NOTE — Telephone Encounter (Signed)
Someone called her and was offering to send her a kit to swab the inside of her cheek and bill to her medicare. This was to diagnose stroke and heart related diseases. She wanted to know if you have ever heard of this?

## 2020-08-05 NOTE — Telephone Encounter (Signed)
Patient advised.

## 2020-08-30 DIAGNOSIS — Z885 Allergy status to narcotic agent status: Secondary | ICD-10-CM | POA: Diagnosis not present

## 2020-08-30 DIAGNOSIS — S91212A Laceration without foreign body of left great toe with damage to nail, initial encounter: Secondary | ICD-10-CM | POA: Diagnosis not present

## 2020-08-30 DIAGNOSIS — S92422B Displaced fracture of distal phalanx of left great toe, initial encounter for open fracture: Secondary | ICD-10-CM | POA: Diagnosis not present

## 2020-08-30 DIAGNOSIS — S92422A Displaced fracture of distal phalanx of left great toe, initial encounter for closed fracture: Secondary | ICD-10-CM | POA: Diagnosis not present

## 2020-08-30 DIAGNOSIS — Z79899 Other long term (current) drug therapy: Secondary | ICD-10-CM | POA: Diagnosis not present

## 2020-08-30 DIAGNOSIS — G8911 Acute pain due to trauma: Secondary | ICD-10-CM | POA: Diagnosis not present

## 2020-08-30 DIAGNOSIS — I10 Essential (primary) hypertension: Secondary | ICD-10-CM | POA: Diagnosis not present

## 2020-08-31 ENCOUNTER — Encounter: Payer: Self-pay | Admitting: Family Medicine

## 2020-09-01 DIAGNOSIS — S92422B Displaced fracture of distal phalanx of left great toe, initial encounter for open fracture: Secondary | ICD-10-CM | POA: Diagnosis not present

## 2020-09-08 DIAGNOSIS — S92422D Displaced fracture of distal phalanx of left great toe, subsequent encounter for fracture with routine healing: Secondary | ICD-10-CM | POA: Diagnosis not present

## 2020-09-15 DIAGNOSIS — S92422D Displaced fracture of distal phalanx of left great toe, subsequent encounter for fracture with routine healing: Secondary | ICD-10-CM | POA: Diagnosis not present

## 2020-09-21 ENCOUNTER — Other Ambulatory Visit: Payer: Self-pay | Admitting: Family Medicine

## 2020-09-21 DIAGNOSIS — I10 Essential (primary) hypertension: Secondary | ICD-10-CM

## 2020-10-06 DIAGNOSIS — S92422D Displaced fracture of distal phalanx of left great toe, subsequent encounter for fracture with routine healing: Secondary | ICD-10-CM | POA: Diagnosis not present

## 2020-11-04 ENCOUNTER — Other Ambulatory Visit: Payer: Self-pay | Admitting: Family Medicine

## 2020-11-04 DIAGNOSIS — Z1231 Encounter for screening mammogram for malignant neoplasm of breast: Secondary | ICD-10-CM

## 2020-12-11 ENCOUNTER — Ambulatory Visit
Admission: RE | Admit: 2020-12-11 | Discharge: 2020-12-11 | Disposition: A | Discharge status: Home / Self Care | Payer: Medicare Other | Source: Ambulatory Visit | Attending: Family Medicine | Admitting: Family Medicine

## 2020-12-11 ENCOUNTER — Other Ambulatory Visit: Payer: Self-pay

## 2020-12-11 DIAGNOSIS — Z1231 Encounter for screening mammogram for malignant neoplasm of breast: Secondary | ICD-10-CM | POA: Diagnosis not present

## 2020-12-13 ENCOUNTER — Other Ambulatory Visit: Payer: Self-pay | Admitting: Family Medicine

## 2020-12-13 DIAGNOSIS — I10 Essential (primary) hypertension: Secondary | ICD-10-CM

## 2020-12-14 ENCOUNTER — Other Ambulatory Visit: Payer: Self-pay | Admitting: Family Medicine

## 2020-12-14 DIAGNOSIS — I10 Essential (primary) hypertension: Secondary | ICD-10-CM

## 2020-12-15 ENCOUNTER — Other Ambulatory Visit: Payer: Self-pay | Admitting: Family Medicine

## 2020-12-15 DIAGNOSIS — I10 Essential (primary) hypertension: Secondary | ICD-10-CM

## 2020-12-15 NOTE — Progress Notes (Signed)
Chief Complaint  Patient presents with  . Medicare Wellness    Fasting AWV/CPE wellness with pelvic. No new concerns.     Sheryl Baker is a 75 y.o. female who presents for annual physical exam, Medicare wellness visit and follow-up on hypertension.  She had an open fracture of the distal phalanx of L great toe in 08/2020, after dropping a heavy pot onto her toe.  She had a dysplastic nevus with moderate atypia removed from her upper back in 12/2019.  Denies any skin concerns today.  Follow-up on hypertension.  BP was noted to be elevated at her physical in 11/2019, started on HCTZ 12.$RemoveBe'5mg'ceOeRlosu$  in 12/2019. She is  tolerating the medication without side effects. She denies headaches, dizziness, chest pain, edema or muscle cramps. BP's have been running 119-130 (usually 120's)/70's. It was 106/70 P 73 prior to leaving her house this morning. Her BP monitor had been verified as accurate in 12/2019.  H/o elevated cholesterol in the past, improved with diet. Denies change in diet, red meat once a week or less. Lab Results  Component Value Date   CHOL 220 (H) 12/05/2019   HDL 73 12/05/2019   LDLCALC 128 (H) 12/05/2019   TRIG 111 12/05/2019   CHOLHDL 3.0 12/05/2019    Immunization History  Administered Date(s) Administered  . Influenza Split 07/01/2009, 07/04/2012, 07/05/2013  . Influenza Whole 07/28/2010, 06/22/2011  . Influenza, High Dose Seasonal PF 07/16/2014, 06/29/2016, 07/21/2017, 07/26/2018, 07/12/2019, 07/10/2020  . PFIZER(Purple Top)SARS-COV-2 Vaccination 12/14/2019, 01/06/2020, 07/31/2020  . Pneumococcal Conjugate-13 04/15/2015  . Pneumococcal Polysaccharide-23 02/17/2010  . Td 07/25/2005  . Tdap 06/22/2011, 08/30/2020  . Zoster 08/26/2011  . Zoster Recombinat (Shingrix) 11/08/2018, 01/17/2019   Last Pap smear:03/2015 normal; no high risk HPV detected Last mammogram:12/11/2020 (not yet reported) Last colonoscopy: 03/2010 Dr.Edwards-mild int hemorrhoids, diverticulosis  sigmoid colon, 5mm polyp proximal ascending colon, 20mm polyp rectum. Path benign (not adenomatous); negative Cologuard 12/2019. Last DEXA:04/2015, normal. Dentist: every 6 months Ophtho: yearly Exercise: Walks friend's dog daily for 30-45 minutes, and walks with friends once on the weekend, for up to 2 hours. Yardwork dailyseasonally(mowing, raking, pruning, moving mulch). Has some weights for wrists/ankles, only sporadically. Vitamin D-OH screen normal (52) in 2012  Annual Wellness Visit: Other doctors involved in patient's care:  Ophtho: Dr. Nicki Reaper (Dr Arlina Robes did cataract surgery; also saw Dr. Brigitte Pulse in f/u (Dr. Arlina Robes no longer there)) Dentist: Dr. Mariea Clonts  GI: Dr. Oletta Lamas Derm: Dr. Wendie Agreste: Malena Peer, PA  Depression screen negative Functional Status survey--unremarkable Fall screen: Negative Mini-Cog screen: normal See full screens in epic.  End of Life Care: She has healthcare power of attorney and living will; copies are scanned in chart (07/2017)  She donates blood regularly  PMH, [place], [place] and FH reviewed and updated.  Outpatient Encounter Medications as of 12/16/2020  Medication Sig Note  . Calcium Carbonate-Vitamin D 600-400 MG-UNIT tablet Take 1 tablet by mouth daily.   . Glucosamine-Chondroit-Vit C-Mn (GLUCOSAMINE CHONDR 1500 COMPLX PO) Take 1 tablet by mouth daily.   . hydrochlorothiazide (MICROZIDE) 12.5 MG capsule TAKE 1 CAPSULE BY MOUTH  DAILY   . Multiple Vitamins-Minerals (MULTIVITAMIN WITH MINERALS) tablet Take 1 tablet by mouth daily.   . vitamin C (ASCORBIC ACID) 500 MG tablet Take 500 mg by mouth daily.   . ferrous sulfate 325 (65 FE) MG tablet Take 325 mg by mouth daily with breakfast. (Patient not taking: Reported on 12/16/2020) 04/15/2015: Takes occasionally, usually prior to donating blood  . Omega-3 Fatty Acids (  FISH OIL) 1000 MG CAPS Take 1 capsule by mouth daily.   (Patient not taking: Reported on 12/16/2020)    No facility-administered encounter  medications on file as of 12/16/2020.   Allergies  Allergen Reactions  . Codeine Nausea And Vomiting    ROS: The patient denies anorexia, fever, headaches, vision changes, decreased hearing, ear pain, sore throat, breast concerns, chest pain, palpitations, dizziness, syncope, dyspnea on exertion, cough, swelling, nausea, vomiting, diarrhea, constipation, abdominal pain, melena, hematochezia, indigestion/heartburn, hematuria, incontinence, dysuria, vaginal bleeding/spotting, discharge, odor or itch, genital lesions, joint pains, numbness, tingling, weakness, tremor, suspicious skin lesions, depression, anxiety, abnormal bleeding/bruising, or enlarged lymph nodes. Some hand/finger pain/arthritis (sore after a lot of yardwork; denies morning stiffness).    PHYSICAL EXAM:  BP (!) 150/70   Pulse 72   Ht $R'5\' 9"'cx$  (1.753 m)   Wt 156 lb 3.2 oz (70.9 kg)   BMI 23.07 kg/m    132/80 on repeat by MD  Wt Readings from Last 3 Encounters:  12/16/20 156 lb 3.2 oz (70.9 kg)  02/27/20 152 lb (68.9 kg)  01/27/20 156 lb 12.8 oz (71.1 kg)    General Appearance:   Alert, cooperative, no distress, appears stated age  Head:   Normocephalic, without obvious abnormality.   Eyes:   PERRL, conjunctiva/corneas clear, EOM's intact, fundi benign  Ears:   Normal TM's and external ear canals  Nose:  Not examined, wearing mask due to COVID-19 pandemic  Throat:  Not examined, wearing mask due to COVID-19 pandemic  Neck:  Supple, no lymphadenopathy; thyroid: no enlargement/tenderness/ nodules; no carotid bruit or JVD  Back:  Spine nontender, no curvature, ROM normal, no CVA tenderness  Lungs:   Clear to auscultation bilaterally without wheezes, rales or ronchi; respirations unlabored  Chest Wall:  Nontender, no mass  Heart:   Regular rate and rhythm, S1 and S2 normal, no murmur, rub or gallop  Breast Exam:   No tenderness, masses, or nipple discharge or inversion. No axillary  lymphadenopathy. WHSS L upper breast, smaller.  Abdomen:   Soft, non-tender, nondistended, normoactive bowel sounds,  no masses, no hepatosplenomegaly  Genitalia:   Normal external genitalia without lesions. BUS and vagina normal; no cervical motion tenderness; bimanual exam normal--uterus and adnexa not enlarged, nontender, no masses. Pap notperformed  Rectal:   Normal tone, no masses or tenderness; guaiac negative stool  Extremities:  No clubbing, cyanosis or edema.R index fingerwithbony deformity at DIP, with some ulnar deviation; similar bony deformity/hypertrophy at DIP of R 2nd finger, c/w OA.  Abnormal distal half of L great toe (nail has fallen off, slightly raised/scarred).  Distal toe is a little violaceous compared to other toes, which improved quickly with elevation, and had brisk capillary refill.  Pulses:  2+ and symmetric all extremities  Skin:  Skin color, texture, turgor normal, no rashes or suspicious lesions. WHSS upper back on R.  Lymph nodes:  Cervical, supraclavicular, and axillary nodes normal  Neurologic:  Normal strength, sensation and gait; reflexes 2+ and symmetric throughout    Psych: Normal mood, affect, hygiene and grooming   ASSESSMENT/PLAN:  Annual physical exam - Plan: POCT Urinalysis DIP (Proadvantage Device)  Medicare annual wellness visit, subsequent  Essential hypertension - well controlled. Cont HCTZ, low Na diet, regular exercise - Plan: Comprehensive metabolic panel, Lipid panel, hydrochlorothiazide (MICROZIDE) 12.5 MG capsule  Medication monitoring encounter - Plan: Comprehensive metabolic panel  Need for pneumococcal vaccination - Plan: Pneumococcal polysaccharide vaccine 23-valent greater than or equal to 2yo subcutaneous/IM  Hypercholesterolemia - cont low cholesterol appt - Plan: Lipid panel   c-met, lipid, cbc   Discussed monthly self breast exams and yearly mammograms (due now); at least 30  minutes of aerobic activity at least 5 days/week, weight-bearing exercise at least 2 days/week; proper sunscreen use reviewed; healthy diet, including goals of calcium and vitamin D intake and alcohol recommendations (less than or equal to 1 drink/day) reviewed; regular seatbelt use; changing batteries in smoke detectors. Immunization recommendations discussed, UTD;continue yearly high dose flu shots. Colon cancer screening is UTD, due again for Cologuard in 12/2022. Pap not needed due to age, low risk.  MOST form updated and reviewed.  Full Code, Full care  CPE 1 year, sooner prn for concerns or higher blood pressures   Medicare Attestation I have personally reviewed: The patient's medical and social history Their use of alcohol, tobacco or illicit drugs Their current medications and supplements The patient's functional ability including ADLs,fall risks, home safety risks, cognitive, and hearing and visual impairment Diet and physical activities Evidence for depression or mood disorders  The patient's weight, height, BMI have been recorded in the chart.  I have made referrals, counseling, and provided education to the patient based on review of the above and I have provided the patient with a written personalized care plan for preventive services.

## 2020-12-15 NOTE — Telephone Encounter (Signed)
Shouldn't be out yet. Has appt tomorrow

## 2020-12-15 NOTE — Patient Instructions (Addendum)
  HEALTH MAINTENANCE RECOMMENDATIONS:  It is recommended that you get at least 30 minutes of aerobic exercise at least 5 days/week (for weight loss, you may need as much as 60-90 minutes). This can be any activity that gets your heart rate up. This can be divided in 10-15 minute intervals if needed, but try and build up your endurance at least once a week.  Weight bearing exercise is also recommended twice weekly.  Eat a healthy diet with lots of vegetables, fruits and fiber.  "Colorful" foods have a lot of vitamins (ie green vegetables, tomatoes, red peppers, etc).  Limit sweet tea, regular sodas and alcoholic beverages, all of which has a lot of calories and sugar.  Up to 1 alcoholic drink daily may be beneficial for women (unless trying to lose weight, watch sugars).  Drink a lot of water.  Calcium recommendations are 1200-1500 mg daily (1500 mg for postmenopausal women or women without ovaries), and vitamin D 1000 IU daily.  This should be obtained from diet and/or supplements (vitamins), and calcium should not be taken all at once, but in divided doses.  Monthly self breast exams and yearly mammograms for women over the age of 61 is recommended.  Sunscreen of at least SPF 30 should be used on all sun-exposed parts of the skin when outside between the hours of 10 am and 4 pm (not just when at beach or pool, but even with exercise, golf, tennis, and yard work!)  Use a sunscreen that says "broad spectrum" so it covers both UVA and UVB rays, and make sure to reapply every 1-2 hours.  Remember to change the batteries in your smoke detectors when changing your clock times in the spring and fall. Carbon monoxide detectors are recommended for your home.  Use your seat belt every time you are in a car, and please drive safely and not be distracted with cell phones and texting while driving.   Sheryl Baker , Thank you for taking time to come for your Medicare Wellness Visit. I appreciate your ongoing  commitment to your health goals. Please review the following plan we discussed and let me know if I can assist you in the future.    This is a list of the screening recommended for you and due dates:  Health Maintenance  Topic Date Due  . COVID-19 Vaccine (3 - Pfizer risk 4-dose series) 02/03/2020  . Tetanus Vaccine  08/30/2030  . Flu Shot  Completed  . DEXA scan (bone density measurement)  Completed  .  Hepatitis C: One time screening is recommended by Center for Disease Control  (CDC) for  adults born from [birth year] through 1966.   Completed  . Pneumonia vaccines  Completed   Cologuard screening for colon cancer will next be due in 12/2022.  Ignore the COVID date above--we just entered the booster dose today. Listen to the news regarding any changes the CDC makes regarding any additional doses.

## 2020-12-16 ENCOUNTER — Other Ambulatory Visit: Payer: Self-pay

## 2020-12-16 ENCOUNTER — Ambulatory Visit (INDEPENDENT_AMBULATORY_CARE_PROVIDER_SITE_OTHER): Payer: Medicare Other | Admitting: Family Medicine

## 2020-12-16 ENCOUNTER — Encounter: Payer: Self-pay | Admitting: Family Medicine

## 2020-12-16 VITALS — BP 132/80 | HR 72 | Ht 69.0 in | Wt 156.2 lb

## 2020-12-16 DIAGNOSIS — I10 Essential (primary) hypertension: Secondary | ICD-10-CM | POA: Diagnosis not present

## 2020-12-16 DIAGNOSIS — E7800 Pure hypercholesterolemia, unspecified: Secondary | ICD-10-CM

## 2020-12-16 DIAGNOSIS — Z23 Encounter for immunization: Secondary | ICD-10-CM | POA: Diagnosis not present

## 2020-12-16 DIAGNOSIS — Z0000 Encounter for general adult medical examination without abnormal findings: Secondary | ICD-10-CM

## 2020-12-16 DIAGNOSIS — Z5181 Encounter for therapeutic drug level monitoring: Secondary | ICD-10-CM | POA: Diagnosis not present

## 2020-12-16 LAB — POCT URINALYSIS DIP (PROADVANTAGE DEVICE)
Bilirubin, UA: NEGATIVE
Blood, UA: NEGATIVE
Glucose, UA: NEGATIVE mg/dL
Ketones, POC UA: NEGATIVE mg/dL
Leukocytes, UA: NEGATIVE
Nitrite, UA: NEGATIVE
Protein Ur, POC: NEGATIVE mg/dL
Specific Gravity, Urine: 1.025
Urobilinogen, Ur: NEGATIVE
pH, UA: 5.5 (ref 5.0–8.0)

## 2020-12-16 MED ORDER — HYDROCHLOROTHIAZIDE 12.5 MG PO CAPS: 12.5000 mg | ORAL_CAPSULE | Freq: Every day | ORAL | 3 refills | Status: DC

## 2020-12-17 LAB — COMPREHENSIVE METABOLIC PANEL
ALT: 19 IU/L (ref 0–32)
AST: 29 IU/L (ref 0–40)
Albumin/Globulin Ratio: 1.6 (ref 1.2–2.2)
Albumin: 4.6 g/dL (ref 3.7–4.7)
Alkaline Phosphatase: 103 IU/L (ref 44–121)
BUN/Creatinine Ratio: 11 — ABNORMAL LOW (ref 12–28)
BUN: 7 mg/dL — ABNORMAL LOW (ref 8–27)
Bilirubin Total: 0.4 mg/dL (ref 0.0–1.2)
CO2: 20 mmol/L (ref 20–29)
Calcium: 9.8 mg/dL (ref 8.7–10.3)
Chloride: 102 mmol/L (ref 96–106)
Creatinine, Ser: 0.63 mg/dL (ref 0.57–1.00)
GFR calc Af Amer: 101 mL/min/{1.73_m2} (ref >59)
GFR calc non Af Amer: 87 mL/min/{1.73_m2} (ref >59)
Globulin, Total: 2.8 g/dL (ref 1.5–4.5)
Glucose: 101 mg/dL — ABNORMAL HIGH (ref 65–99)
Potassium: 4.2 mmol/L (ref 3.5–5.2)
Sodium: 141 mmol/L (ref 134–144)
Total Protein: 7.4 g/dL (ref 6.0–8.5)

## 2020-12-17 LAB — LIPID PANEL
Chol/HDL Ratio: 2.8 ratio (ref 0.0–4.4)
Cholesterol, Total: 204 mg/dL — ABNORMAL HIGH (ref 100–199)
HDL: 74 mg/dL (ref >39)
LDL Chol Calc (NIH): 114 mg/dL — ABNORMAL HIGH (ref 0–99)
Triglycerides: 88 mg/dL (ref 0–149)
VLDL Cholesterol Cal: 16 mg/dL (ref 5–40)

## 2021-01-18 DIAGNOSIS — H2513 Age-related nuclear cataract, bilateral: Secondary | ICD-10-CM | POA: Diagnosis not present

## 2021-11-09 ENCOUNTER — Other Ambulatory Visit: Payer: Self-pay | Admitting: Family Medicine

## 2021-11-09 DIAGNOSIS — Z1231 Encounter for screening mammogram for malignant neoplasm of breast: Secondary | ICD-10-CM

## 2021-12-13 ENCOUNTER — Ambulatory Visit: Payer: Medicare Other

## 2021-12-16 ENCOUNTER — Ambulatory Visit
Admission: RE | Admit: 2021-12-16 | Discharge: 2021-12-16 | Disposition: A | Discharge status: Home / Self Care | Payer: Medicare Other | Source: Ambulatory Visit | Attending: Family Medicine | Admitting: Family Medicine

## 2021-12-16 DIAGNOSIS — Z1231 Encounter for screening mammogram for malignant neoplasm of breast: Secondary | ICD-10-CM | POA: Diagnosis not present

## 2021-12-26 NOTE — Progress Notes (Signed)
Chief Complaint  Patient presents with   Medicare Wellness    Fasting AWV/CPE with pelvic, no new concerns.     Sheryl Baker is a 76 y.o. female who presents for annual physical exam, Medicare wellness visit and follow-up on hypertension.  Follow-up on hypertension.  BP was noted to be elevated at her physical in 11/2019, started on HCTZ 12.48m in 12/2019. She is  tolerating the medication without side effects. She denies headaches, dizziness, chest pain, edema or muscle cramps. BP's have been running 130/70. Her BP monitor had been verified as accurate in 12/2019.  H/o elevated cholesterol in the past, improved with diet. Denies change in diet, red meat once a week or less. Lab Results  Component Value Date   CHOL 204 (H) 12/16/2020   HDL 74 12/16/2020   LDLCALC 114 (H) 12/16/2020   TRIG 88 12/16/2020   CHOLHDL 2.8 12/16/2020   Sugar was slightly elevated at 101 last year.  Immunization History  Administered Date(s) Administered   Influenza Split 07/01/2009, 07/04/2012, 07/05/2013   Influenza Whole 07/28/2010, 06/22/2011   Influenza, High Dose Seasonal PF 07/16/2014, 06/29/2016, 07/21/2017, 07/26/2018, 07/12/2019, 07/10/2020, 07/14/2021   PFIZER(Purple Top)SARS-COV-2 Vaccination 12/14/2019, 01/06/2020, 07/31/2020, 03/15/2021   PNEUMOCOCCAL CONJUGATE-20 06/26/2021   Pfizer Covid-19 Vaccine Bivalent Booster 157yr& up 07/14/2021   Pneumococcal Conjugate-13 04/15/2015   Pneumococcal Polysaccharide-23 02/17/2010, 12/16/2020   Td 07/25/2005   Tdap 06/22/2011, 08/30/2020   Zoster Recombinat (Shingrix) 11/08/2018, 01/17/2019   Zoster, Live 08/26/2011   Last Pap smear: 03/2015 normal; no high risk HPV detected Last mammogram: 11/2021 Last colonoscopy: 03/2010 Dr.Edwards-mild int hemorrhoids, diverticulosis sigmoid colon, 39m39molyp proximal ascending colon, 39mm64mlyp rectum. Path benign (not adenomatous); negative Cologuard 12/2019. Last DEXA: 04/2015, normal. Dentist: every 6  months Ophtho: yearly Exercise: Walks 3-4x/week for 1-1.5 hours. Yardwork daily seasonally (mowing, raking, pruning, moving mulch). Has some weights for wrists/ankles, only sporadically. Vitamin D-OH screen normal (52) in 2012  She donates blood regularly--last donated platelets in December, hasn't donated blood since, scheduled for April. She felt bad towards the end of the platelet donation (and they stopped it a little early).    Patient Care Team: KnapRita Ohara as PCP - General (Family Medicine) Ophtho: Dr. ScotNicki Reaper DingArlina Robes cataract surgery; also saw Dr. ShawBrigitte Pulsef/u (Dr. DingArlina Robeslonger there)) Dentist: Dr. NormMariea Clonts: Dr. EdwaOletta Lamasm: Dr. NolaWendie AgresteraMalena Peer  Depression Screening: [place] 0        Falls screen:  Fall Risk  12/27/2021 12/16/2020 12/05/2019 12/03/2018 11/29/2017  Falls in the past year? 0 0 0 1 No  Number falls in past yr: 0 - - 0 -  Injury with Fall? 0 - - 0 -  Comment - - - other than bruising, no -  Risk for fall due to : No Fall Risks - - - -  Follow up Falls evaluation completed - - - -     Functional Status Survey: Is the patient deaf or have difficulty hearing?: No Does the patient have difficulty seeing, even when wearing glasses/contacts?: No Does the patient have difficulty concentrating, remembering, or making decisions?: No Does the patient have difficulty walking or climbing stairs?: No Does the patient have difficulty dressing or bathing?: No Does the patient have difficulty doing errands alone such as visiting a doctor's office or shopping?: No  Mini-Cog Scoring: 5    End of Life  Care: She has healthcare power of attorney and living will; copies are scanned in chart (07/2017)    PMH, [place], [place] and FH reviewed and updated.  Outpatient Encounter Medications as of 12/27/2021  Medication Sig Note   Calcium Carbonate-Vitamin D 600-400 MG-UNIT tablet  Take 1 tablet by mouth daily.    Glucosamine-Chondroit-Vit C-Mn (GLUCOSAMINE CHONDR 1500 COMPLX PO) Take 1 tablet by mouth daily.    hydrochlorothiazide (MICROZIDE) 12.5 MG capsule Take 1 capsule (12.5 mg total) by mouth daily.    Multiple Vitamins-Minerals (MULTIVITAMIN WITH MINERALS) tablet Take 1 tablet by mouth daily.    vitamin C (ASCORBIC ACID) 500 MG tablet Take 500 mg by mouth daily.    ferrous sulfate 325 (65 FE) MG tablet Take 325 mg by mouth daily with breakfast. (Patient not taking: Reported on 12/16/2020) 04/15/2015: Takes occasionally, usually prior to donating blood   [DISCONTINUED] Omega-3 Fatty Acids (FISH OIL) 1000 MG CAPS Take 1 capsule by mouth daily.   (Patient not taking: Reported on 12/16/2020)    No facility-administered encounter medications on file as of 12/27/2021.   Allergies  Allergen Reactions   Codeine Nausea And Vomiting    ROS:  The patient denies anorexia, fever, headaches,  vision changes, decreased hearing, ear pain, sore throat, breast concerns, chest pain, palpitations, dizziness, syncope, dyspnea on exertion, cough, swelling, nausea, vomiting, diarrhea, constipation, abdominal pain, melena, hematochezia, indigestion/heartburn, hematuria, incontinence, dysuria, vaginal bleeding/spotting, discharge, odor or itch, genital lesions, joint pains, numbness, tingling, weakness, tremor, suspicious skin lesions, depression, anxiety, abnormal bleeding/bruising, or enlarged lymph nodes. Some hand/finger pain/arthritis (sore after a lot of yardwork; denies morning stiffness). Notices some puffiness at her L knee, notices after kneeling a lot. She has a patch on her back, near the bra-line, that feels like a burning, sometimes more grating.  Not itchy.  It is intermittent. Not tender to touch, no spasm. Rubbing aspercreme on it helps.    PHYSICAL EXAM:  BP 136/76    Pulse 68    Ht _0  (1.753 m)    Wt 158 lb 9.6 oz (71.9 kg)    BMI 23.42 kg/m    Wt Readings from Last 3  Encounters:  12/27/21 158 lb 9.6 oz (71.9 kg)  12/16/20 156 lb 3.2 oz (70.9 kg)  02/27/20 152 lb (68.9 kg)    General Appearance:     Alert, cooperative, no distress, appears stated age   Head:     Normocephalic, without obvious abnormality.   Eyes:     PERRL, conjunctiva/corneas clear, EOM's intact, fundi benign  Ears:     Normal TM's and external ear canals   Nose:    Not examined, wearing mask due to COVID-19 pandemic   Throat:    Not examined, wearing mask due to COVID-19 pandemic  Neck:    Supple, no lymphadenopathy;  thyroid:  no enlargement/tenderness/ nodules; no carotid bruit or JVD   Back:     Spine nontender, no curvature, ROM normal, no CVA tenderness   Lungs:      Clear to auscultation bilaterally without wheezes, rales or ronchi; respirations unlabored   Chest Wall:   Nontender, no mass   Heart:     Regular rate and rhythm, S1 and S2 normal, no murmur, rub or gallop   Breast Exam:     No tenderness, masses, or nipple discharge or inversion.  No axillary lymphadenopathy. WHSS L upper breast, smaller.  Abdomen:      Soft, non-tender, nondistended, normoactive bowel sounds,  no  masses, no hepatosplenomegaly   Genitalia:     Normal external genitalia without lesions.  Atrophic changes noted. BUS and vagina normal; no cervical motion tenderness; bimanual exam normal--uterus and adnexa not enlarged, nontender, no masses.  Pap not performed   Rectal:     Normal tone, no masses or tenderness; guaiac negative stool   Extremities:    No clubbing, cyanosis or edema. R index finger with bony deformity at DIP, with some ulnar deviation; similar bony deformity/hypertrophy at DIP of R 2nd finger. L pinkie and R middle DIP's also have hypertrophy/nodules, c/w OA.  Pulses:    2+ and symmetric all extremities   Skin:    Skin color, texture, turgor normal, no rashes or suspicious lesions. WHSS upper back on R. Hyperpigmentation noted at both cheeks, skin is smooth. Distal L great toenail is still  discolored (growing out). Small dry patch (vs early AK) at the L medial scapular area.    Lymph nodes:    Cervical, supraclavicular, inguinal and axillary nodes normal   Neurologic:    Normal strength, sensation and gait; reflexes 2+ and symmetric throughout                    Psych:   Normal mood, affect, hygiene and grooming   ASSESSMENT/PLAN:  Annual physical exam  Medicare annual wellness visit, subsequent  Medication monitoring encounter - Plan: Comprehensive metabolic panel, CBC with Differential/Platelet  Impaired fasting glucose - counseled re: diet, exercise - Plan: Comprehensive metabolic panel, Hemoglobin A1c  Essential hypertension - well controlled. Cont HCTZ, low Na diet, regular exercise - Plan: hydrochlorothiazide (MICROZIDE) 12.5 MG capsule, Comprehensive metabolic panel, Lipid panel  Hypercholesterolemia - cont low cholesterol diet. - Plan: Lipid panel  Hyperpigmentation of skin - discussed trial of OTC bleaching agents vs f/u with derm  Burning of upperL back--reassured regarding normal exam. To try moisturizer.  May need to f/u with derm for hyperpigmentation, and if rough patch increases.  Nontender on exam without spasm or reason for her discomfort/burning.  c-met, lipid, cbc, A1c (with labs) RF HCTZ   Discussed monthly self breast exams and yearly mammograms; at least 30 minutes of aerobic activity at least 5 days/week, weight-bearing exercise at least 2 days/week; proper sunscreen use reviewed; healthy diet, including goals of calcium and vitamin D intake and alcohol recommendations (less than or equal to 1 drink/day) reviewed; regular seatbelt use; changing batteries in smoke detectors.  Immunization recommendations discussed, UTD; continue yearly high dose flu shots.   Colon cancer screening is UTD, due again for Cologuard in 12/2022. Pap not needed due to age, low risk.   MOST form updated and reviewed.   Full Code, Full care  CPE 1 year, sooner prn for  concerns or higher blood pressures   Medicare Attestation I have personally reviewed: The patient's medical and social history Their use of alcohol, tobacco or illicit drugs Their current medications and supplements The patient's functional ability including ADLs,fall risks, home safety risks, cognitive, and hearing and visual impairment Diet and physical activities Evidence for depression or mood disorders  The patient's weight, height, BMI have been recorded in the chart.  I have made referrals, counseling, and provided education to the patient based on review of the above and I have provided the patient with a written personalized care plan for preventive services.

## 2021-12-26 NOTE — Patient Instructions (Addendum)
?  HEALTH MAINTENANCE RECOMMENDATIONS: ? ?It is recommended that you get at least 30 minutes of aerobic exercise at least 5 days/week (for weight loss, you may need as much as 60-90 minutes). This can be any activity that gets your heart rate up. This can be divided in 10-15 minute intervals if needed, but try and build up your endurance at least once a week.  Weight bearing exercise is also recommended twice weekly. ? ?Eat a healthy diet with lots of vegetables, fruits and fiber.  "Colorful" foods have a lot of vitamins (ie green vegetables, tomatoes, red peppers, etc).  Limit sweet tea, regular sodas and alcoholic beverages, all of which has a lot of calories and sugar.  Up to 1 alcoholic drink daily may be beneficial for women (unless trying to lose weight, watch sugars).  Drink a lot of water. ? ?Calcium recommendations are 1200-1500 mg daily (1500 mg for postmenopausal women or women without ovaries), and vitamin D 1000 IU daily.  This should be obtained from diet and/or supplements (vitamins), and calcium should not be taken all at once, but in divided doses. ? ?Monthly self breast exams and yearly mammograms for women over the age of 32 is recommended. ? ?Sunscreen of at least SPF 30 should be used on all sun-exposed parts of the skin when outside between the hours of 10 am and 4 pm (not just when at beach or pool, but even with exercise, golf, tennis, and yard work!)  Use a sunscreen that says "broad spectrum" so it covers both UVA and UVB rays, and make sure to reapply every 1-2 hours. ? ?Remember to change the batteries in your smoke detectors when changing your clock times in the spring and fall. Carbon monoxide detectors are recommended for your home. ? ?Use your seat belt every time you are in a car, and please drive safely and not be distracted with cell phones and texting while driving. ? ? ?Ms. Kulig , ?Thank you for taking time to come for your Medicare Wellness Visit. I appreciate your ongoing  commitment to your health goals. Please review the following plan we discussed and let me know if I can assist you in the future.  ? ?This is a list of the screening recommended for you and due dates:  ?Health Maintenance  ?Topic Date Due  ? Tetanus Vaccine  08/30/2030  ? Pneumonia Vaccine  Completed  ? Flu Shot  Completed  ? DEXA scan (bone density measurement)  Completed  ? COVID-19 Vaccine  Completed  ? Hepatitis C Screening: USPSTF Recommendation to screen - Ages 25-79 yo.  Completed  ? Zoster (Shingles) Vaccine  Completed  ? HPV Vaccine  Aged Out  ? Colon Cancer Screening  Discontinued  ? Cologuard (Stool DNA test)  Discontinued  ? ?Continue yearly flu shots. ?Pay attention to the news regarding further COVID booster recommendations. ? ?Use a pad when kneeling to help protect your knee and prevent bursitis.  Ice after activity if it is swollen or sore. ? ? ? ?

## 2021-12-27 ENCOUNTER — Ambulatory Visit (INDEPENDENT_AMBULATORY_CARE_PROVIDER_SITE_OTHER): Payer: Medicare Other | Admitting: Family Medicine

## 2021-12-27 ENCOUNTER — Encounter: Payer: Self-pay | Admitting: Family Medicine

## 2021-12-27 VITALS — BP 136/76 | HR 68 | Ht 69.0 in | Wt 158.6 lb

## 2021-12-27 DIAGNOSIS — I10 Essential (primary) hypertension: Secondary | ICD-10-CM

## 2021-12-27 DIAGNOSIS — Z0000 Encounter for general adult medical examination without abnormal findings: Secondary | ICD-10-CM

## 2021-12-27 DIAGNOSIS — Z5181 Encounter for therapeutic drug level monitoring: Secondary | ICD-10-CM | POA: Diagnosis not present

## 2021-12-27 DIAGNOSIS — R7301 Impaired fasting glucose: Secondary | ICD-10-CM | POA: Diagnosis not present

## 2021-12-27 DIAGNOSIS — L819 Disorder of pigmentation, unspecified: Secondary | ICD-10-CM

## 2021-12-27 DIAGNOSIS — E7800 Pure hypercholesterolemia, unspecified: Secondary | ICD-10-CM | POA: Diagnosis not present

## 2021-12-27 MED ORDER — HYDROCHLOROTHIAZIDE 12.5 MG PO CAPS: 12.5000 mg | ORAL_CAPSULE | Freq: Every day | ORAL | 3 refills | Status: DC

## 2021-12-28 LAB — CBC WITH DIFFERENTIAL/PLATELET
Basophils Absolute: 0 10*3/uL (ref 0.0–0.2)
Basos: 1 %
EOS (ABSOLUTE): 0.1 10*3/uL (ref 0.0–0.4)
Eos: 1 %
Hematocrit: 37.6 % (ref 34.0–46.6)
Hemoglobin: 11.8 g/dL (ref 11.1–15.9)
Immature Grans (Abs): 0 10*3/uL (ref 0.0–0.1)
Immature Granulocytes: 0 %
Lymphocytes Absolute: 2.5 10*3/uL (ref 0.7–3.1)
Lymphs: 51 %
MCH: 26.3 pg — ABNORMAL LOW (ref 26.6–33.0)
MCHC: 31.4 g/dL — ABNORMAL LOW (ref 31.5–35.7)
MCV: 84 fL (ref 79–97)
Monocytes Absolute: 0.6 10*3/uL (ref 0.1–0.9)
Monocytes: 12 %
Neutrophils Absolute: 1.7 10*3/uL (ref 1.4–7.0)
Neutrophils: 35 %
Platelets: 293 10*3/uL (ref 150–450)
RBC: 4.48 x10E6/uL (ref 3.77–5.28)
RDW: 16 % — ABNORMAL HIGH (ref 11.7–15.4)
WBC: 4.9 10*3/uL (ref 3.4–10.8)

## 2021-12-28 LAB — COMPREHENSIVE METABOLIC PANEL
ALT: 16 IU/L (ref 0–32)
AST: 27 IU/L (ref 0–40)
Albumin/Globulin Ratio: 1.9 (ref 1.2–2.2)
Albumin: 4.7 g/dL (ref 3.7–4.7)
Alkaline Phosphatase: 108 IU/L (ref 44–121)
BUN/Creatinine Ratio: 17 (ref 12–28)
BUN: 12 mg/dL (ref 8–27)
Bilirubin Total: 0.5 mg/dL (ref 0.0–1.2)
CO2: 24 mmol/L (ref 20–29)
Calcium: 9.7 mg/dL (ref 8.7–10.3)
Chloride: 102 mmol/L (ref 96–106)
Creatinine, Ser: 0.69 mg/dL (ref 0.57–1.00)
Globulin, Total: 2.5 g/dL (ref 1.5–4.5)
Glucose: 95 mg/dL (ref 70–99)
Potassium: 4 mmol/L (ref 3.5–5.2)
Sodium: 141 mmol/L (ref 134–144)
Total Protein: 7.2 g/dL (ref 6.0–8.5)
eGFR: 89 mL/min/{1.73_m2} (ref >59)

## 2021-12-28 LAB — HEMOGLOBIN A1C
Est. average glucose Bld gHb Est-mCnc: 126 mg/dL
Hgb A1c MFr Bld: 6 % — ABNORMAL HIGH (ref 4.8–5.6)

## 2021-12-28 LAB — LIPID PANEL
Chol/HDL Ratio: 2.4 ratio (ref 0.0–4.4)
Cholesterol, Total: 195 mg/dL (ref 100–199)
HDL: 80 mg/dL (ref >39)
LDL Chol Calc (NIH): 104 mg/dL — ABNORMAL HIGH (ref 0–99)
Triglycerides: 61 mg/dL (ref 0–149)
VLDL Cholesterol Cal: 11 mg/dL (ref 5–40)

## 2022-01-06 ENCOUNTER — Telehealth: Payer: Self-pay

## 2022-01-06 NOTE — Telephone Encounter (Signed)
Chart reviewed.  In 12/2016 they also said she had abnormal PAD screening (L 0.43, R 0.48).  She underwent ABI screening 02/2017 and it was completely normal. ?I just saw the patient and she walks 1-1.5 hours a few times a week and denies claudication. ?Please contact patient and advise her that they again got an abnormal screening test for peripheral arterial disease.  Remind her they found it abnormal when checkedin 2018, and the subsequent ABI through [facility] was normal. If she has no leg pain with walking (claudication), then I do not think we need to proceed with additional testing at this time. If she has any discomfort with exercise that is relieved by rest, then we need to order another bilateral ABI (not vascular consult).  ?Thanks ?

## 2022-01-06 NOTE — Telephone Encounter (Signed)
Diane NP with house calls called to report the pts. Findings on her PAD test. She had severe bilateral PAD and recommended a vascular consult. Lt. Was 0.38 and rt. 0.37. ?

## 2022-01-07 NOTE — Telephone Encounter (Signed)
I called pt. Had to [facility] for her to call back.  ?

## 2022-01-10 ENCOUNTER — Telehealth: Payer: Self-pay

## 2022-01-10 NOTE — Telephone Encounter (Signed)
Pt. Called back stating she really doesn't have too much pain in her legs. She stats when she works outside she has discomfort but she is 76 years old. She will do what ever you recommend though.  ?

## 2022-02-25 DIAGNOSIS — H25013 Cortical age-related cataract, bilateral: Secondary | ICD-10-CM | POA: Diagnosis not present

## 2022-06-29 ENCOUNTER — Encounter: Payer: Self-pay | Admitting: Internal Medicine

## 2022-07-06 ENCOUNTER — Ambulatory Visit (INDEPENDENT_AMBULATORY_CARE_PROVIDER_SITE_OTHER): Payer: Medicare Other | Admitting: Family Medicine

## 2022-07-06 ENCOUNTER — Encounter: Payer: Self-pay | Admitting: Family Medicine

## 2022-07-06 VITALS — BP 160/62 | HR 72 | Ht 69.0 in | Wt 153.6 lb

## 2022-07-06 DIAGNOSIS — M7042 Prepatellar bursitis, left knee: Secondary | ICD-10-CM

## 2022-07-06 DIAGNOSIS — I10 Essential (primary) hypertension: Secondary | ICD-10-CM

## 2022-07-06 DIAGNOSIS — Z7185 Encounter for immunization safety counseling: Secondary | ICD-10-CM

## 2022-07-06 MED ORDER — MELOXICAM 15 MG PO TABS: 15.0000 mg | ORAL_TABLET | Freq: Every day | ORAL | 0 refills | Status: DC

## 2022-07-06 NOTE — Progress Notes (Signed)
Chief Complaint  Patient presents with   Joint Swelling    L knee pain/swelling. States you told her in March she had some bursitis.    In the last 2-3 weeks she notices more puffiness overlying the left knee . It used to improve at night, but recently has stayed swollen. She describes a tightness, stiffness. Not terribly painful. It hurt to kneel on it (felt like she knelt on a rock, forgot about it).  She is in the garden a lot, since knee bothers her she uses a pad on the ground and pads that she wears on her knees.  She mentioned this at her physical, noticing some puffiness at her L knee, notices after kneeling a lot. It was not noted to be swollen at that visit (normal exam). This got worse recently, as stated above.  She has taken ibuprofen '200mg'$  before bed, if bothering her, not taking anything regularly.  Hypertension:  BP's have been 120-130/60's, last checked on Monday. Compliant with taking HCTZ.  Denies headaches, chest pain or dizziness.    Got a pneumovax 23 and flu shot on Monday Immunizations reviewed--did NOT need the pneumovax 23--she had one in 11/2020, as well as having had a prevnar-20 in 06/2021.  PMH, PSH, SH reviewed.  Outpatient Encounter Medications as of 07/06/2022  Medication Sig Note   Calcium Carbonate-Vitamin D 600-400 MG-UNIT tablet Take 1 tablet by mouth daily.    Glucosamine-Chondroit-Vit C-Mn (GLUCOSAMINE CHONDR 1500 COMPLX PO) Take 1 tablet by mouth daily.    hydrochlorothiazide (MICROZIDE) 12.5 MG capsule Take 1 capsule (12.5 mg total) by mouth daily.    Multiple Vitamins-Minerals (MULTIVITAMIN WITH MINERALS) tablet Take 1 tablet by mouth daily.    vitamin C (ASCORBIC ACID) 500 MG tablet Take 500 mg by mouth daily.    ferrous sulfate 325 (65 FE) MG tablet Take 325 mg by mouth daily with breakfast. (Patient not taking: Reported on 12/16/2020) 04/15/2015: Takes occasionally, usually prior to donating blood   No facility-administered encounter medications  on file as of 07/06/2022.   Allergies  Allergen Reactions   Codeine Nausea And Vomiting   ROS: no fever, chills, URI symptoms, headaches, dizziness, chest pain, edema. L knee swelling and discomfort per HPI. Moods are good. Some cat scratches, no rashes, bruising/bleeding No abdominal pain.   Last creatinine normal. Lab Results  Component Value Date   CREATININE 0.69 12/27/2021     PHYSICAL EXAM:  BP (!) 170/60   Pulse 72   Ht '5\' 9"'$  (1.753 m)   Wt 153 lb 9.6 oz (69.7 kg)   BMI 22.68 kg/m  Repeat BP essentially unchanged. Well-appearing, pleasant female, in good spirits, in no distress  L knee-- There is a 4x4 cm soft tissue swelling, very superficially, overlying the inferior portion of the patella and extending inferiorly. Just above this there is a horizontal superficial scratch/excoriation (from a cat). There is no associated erythema, warmth or tenderness. No effusion of the knee, FROM. No edema, normal pulses.  ASSESSMENT/PLAN:  Prepatellar bursitis of left knee - treat with NSAIDs, ice; if not responding, can return for cortisone injection. - Plan: meloxicam (MOBIC) 15 MG tablet  Essential hypertension - poss white coat component. normal at home, elevated in office. Will check again at home. Cont HCTZ  Vaccine counseling - discussed RSV vaccine, COVID booster. No further pneumonia vaccines recommended (last wasn't needed)   Risks/SE of NSAIDs reviewed in detail.   I do recommend that you get the new RSV vaccine--you need to  get it from the pharmacy, and need to wait 2 weeks from your flu shot. I also encourage you to get the new COVID booster when it becomes available (also 2 weeks apart from other vaccines).   Take the meloxicam once daily with food, every day until the knee is better (expect 7-10 days, you can use the full prescription if needed). If the medication bothers your stomach, cut the dose in half. Always take it with food. Do NOT take any aleve,  naproxen, ibuprofen/motrin/advil, BC/Goody powder, as these are similar to the prescribed medication. You CAN take tylenol products, if needed for pain.  Avoid kneeling; ice and elevated as needed. See attached information.  If your bursitis doesn't respond to this anti-inflammatory course, then we can try a cortisone injection.

## 2022-07-06 NOTE — Patient Instructions (Addendum)
Hold all exercises for 10-15 seconds, do them 10 times, twice daily (filling in the blanks on the exercise handout).   Take the meloxicam once daily with food, every day until the knee is better (expect 7-10 days, you can use the full prescription if needed). If the medication bothers your stomach, cut the dose in half. Always take it with food. Do NOT take any aleve, naproxen, ibuprofen/motrin/advil, BC/Goody powder, as these are similar to the prescribed medication. You CAN take tylenol products, if needed for pain.  Avoid kneeling; ice and elevated as needed. See attached information.  If your bursitis doesn't respond to this anti-inflammatory course, then we can try a cortisone injection.  I do recommend that you get the new RSV vaccine--you need to get it from the pharmacy, and need to wait 2 weeks from your flu shot. I also encourage you to get the new COVID booster when it becomes available (also 2 weeks apart from other vaccines).  You didn't need another pneumonia vaccine, and you will NOT NEED another pneumonia vaccine (ever, or at least for 10 years).  Please don't let the pharmacy give you any more pneumonia vaccines!

## 2022-07-22 NOTE — Telephone Encounter (Signed)
Appt made 07/27/22 kh

## 2022-07-26 NOTE — Progress Notes (Unsigned)
No chief complaint on file.    She was seen 9/13 with complaint of swelling below L knee. Diagnosed with prepatellar bursitis. She was prescribed Meloxicam '15mg'$ . She has persistent swelling, and presents for aspiration/cortisone injection today.   PMH, PSH, SH reviewed    ROS: no fever, chills, URI symptoms, headaches, dizziness, chest pain, bleeding, bruising, rash or other complaints other than L knee pain/swelling    PHYSICAL EXAM:  There were no vitals taken for this visit.  Wt Readings from Last 3 Encounters:  07/06/22 153 lb 9.6 oz (69.7 kg)  12/27/21 158 lb 9.6 oz (71.9 kg)  12/16/20 156 lb 3.2 oz (70.9 kg)    LAST VISIT-- L knee-- There is a 4x4 cm soft tissue swelling, very superficially, overlying the inferior portion of the patella and extending inferiorly. Just above this there is a horizontal superficial scratch/excoriation (from a cat). There is no associated erythema, warmth or tenderness. No effusion of the knee, FROM. No edema, normal pulses.     ASSESSMENT/PLAN:   Prepatellar bursitis of left knee   Verbal consent was obtained for procedure. She understands the risks (bleeding, infection, recurrence), and alternatives, and wishes to proceed. Area was cleansed with betadine and alcohol. Aspirated Injected TAC '20mg'$ , 2cc lido without epi   NEED TUBE TO SEND OFF FLUID FOR GRAM STAIN, CULTURE AND SENSITIVITY, AND MICROSCOPIC ANALYSIS (CELL COUNT, CRYSTALS)-- NEED TO ASK MARIA HOW TO ORDER THESE, what tubes needed This is for the fluid that is aspirated, prior to injection.  I'll need help during procedure,  might need a hemostat to help change out the syringe. I'll need a 10cc syringe and hemostat, in addition to what I draw up for the injection.

## 2022-07-27 ENCOUNTER — Encounter: Payer: Self-pay | Admitting: Family Medicine

## 2022-07-27 ENCOUNTER — Other Ambulatory Visit: Payer: Self-pay | Admitting: Family Medicine

## 2022-07-27 ENCOUNTER — Ambulatory Visit (INDEPENDENT_AMBULATORY_CARE_PROVIDER_SITE_OTHER): Payer: Medicare Other | Admitting: Family Medicine

## 2022-07-27 VITALS — BP 138/70 | HR 80 | Ht 69.0 in | Wt 151.8 lb

## 2022-07-27 DIAGNOSIS — I10 Essential (primary) hypertension: Secondary | ICD-10-CM

## 2022-07-27 DIAGNOSIS — M7042 Prepatellar bursitis, left knee: Secondary | ICD-10-CM

## 2022-07-27 NOTE — Patient Instructions (Signed)
Today we are draining the fluid from the prepatellar bursa. There is a good possibility that the fluid will re-accumulate. Please try and keep compression on the area to help prevent this (ACE wrap or a compression sleeve, WITHOUT a cut out for the kneecap). Any time there is a break in the skin, there is potential risk for infection. You need to return immediately if there is redness, swelling and pain over this area, or any fever.

## 2022-07-31 LAB — BODY FLUID CULTURE

## 2022-07-31 LAB — PATHOLOGIST SMEAR REVIEW

## 2022-07-31 LAB — SYNOVIAL FLUID, CELL COUNT

## 2022-07-31 LAB — GRAM STAIN: Organism ID, Bacteria: NONE SEEN

## 2022-08-02 ENCOUNTER — Encounter: Payer: Self-pay | Admitting: Internal Medicine

## 2022-08-02 ENCOUNTER — Encounter: Payer: Self-pay | Admitting: Family Medicine

## 2022-08-02 LAB — SYNOVIAL FLUID, CELL COUNT
Eos, Fluid: 0 %
Lining Cells, Synovial: 0 %
Lymphs, Fluid: 96 %
Macrophages Fld: 3 %
Nuc cell # Fld: 162 cells/uL (ref 0–200)
Polys, Fluid: 0 %

## 2022-08-25 DIAGNOSIS — H353131 Nonexudative age-related macular degeneration, bilateral, early dry stage: Secondary | ICD-10-CM | POA: Diagnosis not present

## 2022-12-03 ENCOUNTER — Other Ambulatory Visit: Payer: Self-pay | Admitting: Family Medicine

## 2022-12-03 DIAGNOSIS — I10 Essential (primary) hypertension: Secondary | ICD-10-CM

## 2022-12-26 ENCOUNTER — Other Ambulatory Visit: Payer: Self-pay | Admitting: Family Medicine

## 2022-12-26 DIAGNOSIS — Z1231 Encounter for screening mammogram for malignant neoplasm of breast: Secondary | ICD-10-CM

## 2022-12-28 ENCOUNTER — Ambulatory Visit
Admission: RE | Admit: 2022-12-28 | Discharge: 2022-12-28 | Disposition: A | Discharge status: Home / Self Care | Payer: Medicare Other | Source: Ambulatory Visit | Attending: Family Medicine | Admitting: Family Medicine

## 2022-12-28 DIAGNOSIS — Z1231 Encounter for screening mammogram for malignant neoplasm of breast: Secondary | ICD-10-CM

## 2022-12-28 NOTE — Patient Instructions (Signed)
  HEALTH MAINTENANCE RECOMMENDATIONS:  It is recommended that you get at least 30 minutes of aerobic exercise at least 5 days/week (for weight loss, you may need as much as 60-90 minutes). This can be any activity that gets your heart rate up. This can be divided in 10-15 minute intervals if needed, but try and build up your endurance at least once a week.  Weight bearing exercise is also recommended twice weekly.  Eat a healthy diet with lots of vegetables, fruits and fiber.  "Colorful" foods have a lot of vitamins (ie green vegetables, tomatoes, red peppers, etc).  Limit sweet tea, regular sodas and alcoholic beverages, all of which has a lot of calories and sugar.  Up to 1 alcoholic drink daily may be beneficial for women (unless trying to lose weight, watch sugars).  Drink a lot of water.  Calcium recommendations are 1200-1500 mg daily (1500 mg for postmenopausal women or women without ovaries), and vitamin D 1000 IU daily.  This should be obtained from diet and/or supplements (vitamins), and calcium should not be taken all at once, but in divided doses.  Monthly self breast exams and yearly mammograms for women over the age of 6 is recommended.  Sunscreen of at least SPF 30 should be used on all sun-exposed parts of the skin when outside between the hours of 10 am and 4 pm (not just when at beach or pool, but even with exercise, golf, tennis, and yard work!)  Use a sunscreen that says "broad spectrum" so it covers both UVA and UVB rays, and make sure to reapply every 1-2 hours.  Remember to change the batteries in your smoke detectors when changing your clock times in the spring and fall. Carbon monoxide detectors are recommended for your home.  Use your seat belt every time you are in a car, and please drive safely and not be distracted with cell phones and texting while driving.   Sheryl Baker , Thank you for taking time to come for your Medicare Wellness Visit. I appreciate your ongoing  commitment to your health goals. Please review the following plan we discussed and let me know if I can assist you in the future.   This is a list of the screening recommended for you and due dates:  Health Maintenance  Topic Date Due   COVID-19 Vaccine (6 - 2023-24 season) 06/24/2022   Medicare Annual Wellness Visit  12/29/2023   DTaP/Tdap/Td vaccine (4 - Td or Tdap) 08/30/2030   Pneumonia Vaccine  Completed   Flu Shot  Completed   DEXA scan (bone density measurement)  Completed   Hepatitis C Screening: USPSTF Recommendation to screen - Ages 69-79 yo.  Completed   Zoster (Shingles) Vaccine  Completed   HPV Vaccine  Aged Out   Colon Cancer Screening  Discontinued   Cologuard (Stool DNA test)  Discontinued

## 2022-12-28 NOTE — Progress Notes (Unsigned)
No chief complaint on file.   Sheryl Baker is a 77 y.o. female who presents for annual physical exam, Medicare wellness visit and follow-up on chronic problems.  She was seen in Sept/October with pre-patellar bursitis.  Follow-up on hypertension.  She reports compliance with taking HCTZ 12.'5mg'$  (started in 12/2019). She is  tolerating the medication without side effects. She denies headaches, dizziness, chest pain, edema or muscle cramps. BP's have been running   Her BP monitor had been verified as accurate in 12/2019, rechecked 07/2022.  BP Readings from Last 3 Encounters:  07/27/22 138/70  07/06/22 (!) 160/62  12/27/21 136/76    H/o elevated cholesterol in the past, improved with diet. Denies change in diet, red meat once a week or less. Lab Results  Component Value Date   CHOL 195 12/27/2021   HDL 80 12/27/2021   LDLCALC 104 (H) 12/27/2021   TRIG 61 12/27/2021   CHOLHDL 2.4 12/27/2021   Sugar was slightly elevated at 101 in 2022, was 95 last year.  Immunization History  Administered Date(s) Administered   Influenza Split 07/01/2009, 07/04/2012, 07/05/2013   Influenza Whole 07/28/2010, 06/22/2011   Influenza, High Dose Seasonal PF 07/16/2014, 06/29/2016, 07/21/2017, 07/26/2018, 07/12/2019, 07/10/2020, 07/14/2021, 07/04/2022   PFIZER(Purple Top)SARS-COV-2 Vaccination 12/14/2019, 01/06/2020, 07/31/2020, 03/15/2021   PNEUMOCOCCAL CONJUGATE-20 06/26/2021   Pfizer Covid-19 Vaccine Bivalent Booster 42yr & up 07/14/2021   Pneumococcal Conjugate-13 04/15/2015   Pneumococcal Polysaccharide-23 02/17/2010, 12/16/2020, 07/04/2022   Respiratory Syncytial Virus Vaccine,Recomb Aduvanted(Arexvy) 07/18/2022   Td 07/25/2005   Tdap 06/22/2011, 08/30/2020   Zoster Recombinat (Shingrix) 11/08/2018, 01/17/2019   Zoster, Live 08/26/2011   Last Pap smear: 03/2015 normal; no high risk HPV detected Last mammogram: yesterday (report pending) Last colonoscopy: 03/2010 Dr.Edwards-mild  int hemorrhoids, diverticulosis sigmoid colon, 228mpolyp proximal ascending colon, [id number] rectum. Path benign (not adenomatous); negative Cologuard 12/2019. Last DEXA: 04/2015, normal. Dentist: every 6 months Ophtho: yearly Exercise:  Walks 3-4x/week for 1-1.5 hours. Yardwork daily seasonally (mowing, raking, pruning, moving mulch). Has some weights for wrists/ankles, only sporadically. Vitamin D-OH screen normal (52) in 2012  She donates blood regularly--last donated     Patient Care Team: KnaRita OharaD as PCP - General (Family Medicine) Ophtho: Dr. ScoNicki Reaperr DinArlina Robesd cataract surgery; also saw Dr. ShaBrigitte Pulse f/u (Dr. DinArlina Robes longer there)) Dentist: Dr. NorMariea ClontsI: Dr. EdwOletta Lamasrm: Dr. NolWendie AgrestearMalena PeerA  Depression Screening: [place] from [place]20[place] Total Score 0        Falls screen:     07/06/2022    1:19 PM 12/27/2021    9:25 AM 12/16/2020    8:28 AM 12/05/2019    8:22 AM 12/03/2018    9:12 AM  [place] the past year? 0 0 0 0 1  Number falls in past yr: 0 0   0  Injury with Fall? 0 0   0  Comment     other than bruising, no  Risk for fall due to : No Fall Risks No Fall Risks     Follow up Falls evaluation completed Falls evaluation completed        Functional Status Survey:         End of Life Care: She has healthcare power of attorney and living will; copies are scanned in chart (07/2017)    PMH, PSH[place] FH reviewed and updated.     ROS:  The patient  denies anorexia, fever, headaches,  vision changes, decreased hearing, ear pain, sore throat, breast concerns, chest pain, palpitations, dizziness, syncope, dyspnea on exertion, cough, swelling, nausea, vomiting, diarrhea, constipation, abdominal pain, melena, hematochezia, indigestion/heartburn, hematuria, incontinence, dysuria, vaginal bleeding/spotting, discharge, odor or itch, genital lesions, joint pains, numbness, tingling,  weakness, tremor, suspicious skin lesions, depression, anxiety, abnormal bleeding/bruising, or enlarged lymph nodes. Some hand/finger pain/arthritis (sore after a lot of yardwork; denies morning stiffness). Left knee (bursitis?)    PHYSICAL EXAM:  There were no vitals taken for this visit.   Wt Readings from Last 3 Encounters:  07/27/22 151 lb 12.8 oz (68.9 kg)  07/06/22 153 lb 9.6 oz (69.7 kg)  12/27/21 158 lb 9.6 oz (71.9 kg)    General Appearance:     Alert, cooperative, no distress, appears stated age   Head:     Normocephalic, without obvious abnormality.   Eyes:     PERRL, conjunctiva/corneas clear, EOM's intact, fundi benign  Ears:     Normal TM's and external ear canals   Nose:    Not examined, wearing mask due to COVID-19 pandemic   Throat:    Not examined, wearing mask due to COVID-19 pandemic  Neck:    Supple, no lymphadenopathy;  thyroid:  no enlargement/tenderness/ nodules; no carotid bruit or JVD   Back:     Spine nontender, no curvature, ROM normal, no CVA tenderness   Lungs:      Clear to auscultation bilaterally without wheezes, rales or ronchi; respirations unlabored   Chest Wall:   Nontender, no mass   Heart:     Regular rate and rhythm, S1 and S2 normal, no murmur, rub or gallop   Breast Exam:     No tenderness, masses, or nipple discharge or inversion.  No axillary lymphadenopathy. WHSS L upper breast, smaller.  Abdomen:      Soft, non-tender, nondistended, normoactive bowel sounds,  no masses, no hepatosplenomegaly   Genitalia:     Normal external genitalia without lesions.  Atrophic changes noted. BUS and vagina normal; no cervical motion tenderness; bimanual exam normal--uterus and adnexa not enlarged, nontender, no masses.  Pap not performed   Rectal:     Normal tone, no masses or tenderness; guaiac negative stool   Extremities:    No clubbing, cyanosis or edema. R index finger with bony deformity at DIP, with some ulnar deviation; similar bony  deformity/hypertrophy at DIP of R 2nd finger. L pinkie and R middle DIP's also have hypertrophy/nodules, c/w OA.  Pulses:    2+ and symmetric all extremities   Skin:    Skin color, texture, turgor normal, no rashes or suspicious lesions. WHSS upper back on R.  Distal L great toenail is still discolored (growing out). Small dry patch (vs early AK) at the L medial scapular area.    Lymph nodes:    Cervical, supraclavicular, inguinal and axillary nodes normal   Neurologic:    Normal strength, sensation and gait; reflexes 2+ and symmetric throughout                    Psych:   Normal mood, affect, hygiene and grooming  ***UPDATE fingers, L knee, update skin--L great toenail, L medial scapular skin Wearing mask?   ASSESSMENT/PLAN:   Enter/offer COVID booster  Cologuard due  now.  A1c--see if pt wants separate to discuss results, or with labs c-met, cbc.  Consider lipids (if change in diet)  RF HCTZ   Discussed monthly self breast exams  and yearly mammograms; at least 30 minutes of aerobic activity at least 5 days/week, weight-bearing exercise at least 2 days/week; proper sunscreen use reviewed; healthy diet, including goals of calcium and vitamin D intake and alcohol recommendations (less than or equal to 1 drink/day) reviewed; regular seatbelt use; changing batteries in smoke detectors.  Immunization recommendations discussed, UTD; continue yearly high dose flu shots.   Colon cancer screening is UTD, due again for Cologuard now. Pap not needed due to age, low risk. Consider repeat DEXA in the next 1-2 years.   MOST form updated and reviewed.   Full Code, Full care  CPE 1 year, sooner prn for concerns or higher blood pressures   Medicare Attestation I have personally reviewed: The patient's medical and social history Their use of alcohol, tobacco or illicit drugs Their current medications and supplements The patient's functional ability including ADLs,fall risks, home safety risks,  cognitive, and hearing and visual impairment Diet and physical activities Evidence for depression or mood disorders  The patient's weight, height, BMI have been recorded in the chart.  I have made referrals, counseling, and provided education to the patient based on review of the above and I have provided the patient with a written personalized care plan for preventive services.

## 2022-12-29 ENCOUNTER — Encounter: Payer: Self-pay | Admitting: Family Medicine

## 2022-12-29 ENCOUNTER — Ambulatory Visit (INDEPENDENT_AMBULATORY_CARE_PROVIDER_SITE_OTHER): Payer: Medicare Other | Admitting: Family Medicine

## 2022-12-29 VITALS — BP 130/70 | HR 72 | Ht 69.75 in | Wt 154.4 lb

## 2022-12-29 DIAGNOSIS — Z52008 Unspecified donor, other blood: Secondary | ICD-10-CM

## 2022-12-29 DIAGNOSIS — R7301 Impaired fasting glucose: Secondary | ICD-10-CM | POA: Diagnosis not present

## 2022-12-29 DIAGNOSIS — Z1211 Encounter for screening for malignant neoplasm of colon: Secondary | ICD-10-CM

## 2022-12-29 DIAGNOSIS — I10 Essential (primary) hypertension: Secondary | ICD-10-CM

## 2022-12-29 DIAGNOSIS — Z23 Encounter for immunization: Secondary | ICD-10-CM | POA: Diagnosis not present

## 2022-12-29 DIAGNOSIS — E7800 Pure hypercholesterolemia, unspecified: Secondary | ICD-10-CM | POA: Diagnosis not present

## 2022-12-29 DIAGNOSIS — Z5181 Encounter for therapeutic drug level monitoring: Secondary | ICD-10-CM

## 2022-12-29 DIAGNOSIS — R748 Abnormal levels of other serum enzymes: Secondary | ICD-10-CM | POA: Diagnosis not present

## 2022-12-29 DIAGNOSIS — Z0000 Encounter for general adult medical examination without abnormal findings: Secondary | ICD-10-CM | POA: Diagnosis not present

## 2022-12-29 LAB — POCT GLYCOSYLATED HEMOGLOBIN (HGB A1C): Hemoglobin A1C: 6.1 % — AB (ref 4.0–5.6)

## 2022-12-29 MED ORDER — HYDROCHLOROTHIAZIDE 12.5 MG PO CAPS: 12.5000 mg | ORAL_CAPSULE | Freq: Every day | ORAL | 3 refills | Status: DC

## 2022-12-30 LAB — COMPREHENSIVE METABOLIC PANEL
ALT: 17 IU/L (ref 0–32)
AST: 25 IU/L (ref 0–40)
Albumin/Globulin Ratio: 1.8 (ref 1.2–2.2)
Albumin: 4.6 g/dL (ref 3.8–4.8)
Alkaline Phosphatase: 110 IU/L (ref 44–121)
BUN/Creatinine Ratio: 15 (ref 12–28)
BUN: 10 mg/dL (ref 8–27)
Bilirubin Total: 0.4 mg/dL (ref 0.0–1.2)
CO2: 22 mmol/L (ref 20–29)
Calcium: 9.6 mg/dL (ref 8.7–10.3)
Chloride: 102 mmol/L (ref 96–106)
Creatinine, Ser: 0.67 mg/dL (ref 0.57–1.00)
Globulin, Total: 2.6 g/dL (ref 1.5–4.5)
Glucose: 94 mg/dL (ref 70–99)
Potassium: 3.7 mmol/L (ref 3.5–5.2)
Sodium: 141 mmol/L (ref 134–144)
Total Protein: 7.2 g/dL (ref 6.0–8.5)
eGFR: 89 mL/min/{1.73_m2} (ref >59)

## 2022-12-30 LAB — CBC WITH DIFFERENTIAL/PLATELET
Basophils Absolute: 0 10*3/uL (ref 0.0–0.2)
Basos: 1 %
EOS (ABSOLUTE): 0 10*3/uL (ref 0.0–0.4)
Eos: 1 %
Hematocrit: 38.8 % (ref 34.0–46.6)
Hemoglobin: 12.4 g/dL (ref 11.1–15.9)
Immature Grans (Abs): 0 10*3/uL (ref 0.0–0.1)
Immature Granulocytes: 0 %
Lymphocytes Absolute: 2.2 10*3/uL (ref 0.7–3.1)
Lymphs: 42 %
MCH: 28.6 pg (ref 26.6–33.0)
MCHC: 32 g/dL (ref 31.5–35.7)
MCV: 90 fL (ref 79–97)
Monocytes Absolute: 0.5 10*3/uL (ref 0.1–0.9)
Monocytes: 9 %
Neutrophils Absolute: 2.5 10*3/uL (ref 1.4–7.0)
Neutrophils: 47 %
Platelets: 256 10*3/uL (ref 150–450)
RBC: 4.33 x10E6/uL (ref 3.77–5.28)
RDW: 14.1 % (ref 11.7–15.4)
WBC: 5.3 10*3/uL (ref 3.4–10.8)

## 2022-12-30 LAB — FERRITIN: Ferritin: 30 ng/mL (ref 15–150)

## 2023-01-10 DIAGNOSIS — Z1211 Encounter for screening for malignant neoplasm of colon: Secondary | ICD-10-CM | POA: Diagnosis not present

## 2023-01-15 LAB — COLOGUARD: COLOGUARD: NEGATIVE

## 2023-02-27 DIAGNOSIS — H353131 Nonexudative age-related macular degeneration, bilateral, early dry stage: Secondary | ICD-10-CM | POA: Diagnosis not present

## 2023-08-30 DIAGNOSIS — H353131 Nonexudative age-related macular degeneration, bilateral, early dry stage: Secondary | ICD-10-CM | POA: Diagnosis not present

## 2023-09-21 ENCOUNTER — Other Ambulatory Visit: Payer: Self-pay | Admitting: Family Medicine

## 2023-09-21 DIAGNOSIS — I10 Essential (primary) hypertension: Secondary | ICD-10-CM

## 2023-11-22 ENCOUNTER — Other Ambulatory Visit: Payer: Self-pay | Admitting: Family Medicine

## 2023-11-22 DIAGNOSIS — Z1231 Encounter for screening mammogram for malignant neoplasm of breast: Secondary | ICD-10-CM

## 2023-12-13 ENCOUNTER — Other Ambulatory Visit: Payer: Self-pay | Admitting: Medical Genetics

## 2023-12-19 ENCOUNTER — Encounter (INDEPENDENT_AMBULATORY_CARE_PROVIDER_SITE_OTHER): Payer: Self-pay

## 2023-12-19 ENCOUNTER — Other Ambulatory Visit (HOSPITAL_COMMUNITY)
Admission: RE | Admit: 2023-12-19 | Discharge: 2023-12-19 | Disposition: A | Discharge status: Home / Self Care | Payer: Self-pay | Source: Ambulatory Visit | Attending: Medical Genetics | Admitting: Medical Genetics

## 2023-12-29 ENCOUNTER — Ambulatory Visit
Admission: RE | Admit: 2023-12-29 | Discharge: 2023-12-29 | Disposition: A | Discharge status: Home / Self Care | Payer: Medicare Other | Source: Ambulatory Visit | Attending: Family Medicine | Admitting: Family Medicine

## 2023-12-29 DIAGNOSIS — Z1231 Encounter for screening mammogram for malignant neoplasm of breast: Secondary | ICD-10-CM

## 2024-01-02 LAB — GENECONNECT MOLECULAR SCREEN: Genetic Analysis Overall Interpretation: NEGATIVE

## 2024-01-03 NOTE — Patient Instructions (Incomplete)
  HEALTH MAINTENANCE RECOMMENDATIONS:  It is recommended that you get at least 30 minutes of aerobic exercise at least 5 days/week (for weight loss, you may need as much as 60-90 minutes). This can be any activity that gets your heart rate up. This can be divided in 10-15 minute intervals if needed, but try and build up your endurance at least once a week.  Weight bearing exercise is also recommended twice weekly.  Eat a healthy diet with lots of vegetables, fruits and fiber.  "Colorful" foods have a lot of vitamins (ie green vegetables, tomatoes, red peppers, etc).  Limit sweet tea, regular sodas and alcoholic beverages, all of which has a lot of calories and sugar.  Up to 1 alcoholic drink daily may be beneficial for women (unless trying to lose weight, watch sugars).  Drink a lot of water.  Calcium recommendations are 1200-1500 mg daily (1500 mg for postmenopausal women or women without ovaries), and vitamin D 1000 IU daily.  This should be obtained from diet and/or supplements (vitamins), and calcium should not be taken all at once, but in divided doses.  Monthly self breast exams and yearly mammograms for women over the age of 50 is recommended.  Sunscreen of at least SPF 30 should be used on all sun-exposed parts of the skin when outside between the hours of 10 am and 4 pm (not just when at beach or pool, but even with exercise, golf, tennis, and yard work!)  Use a sunscreen that says "broad spectrum" so it covers both UVA and UVB rays, and make sure to reapply every 1-2 hours.  Remember to change the batteries in your smoke detectors when changing your clock times in the spring and fall. Carbon monoxide detectors are recommended for your home.  Use your seat belt every time you are in a car, and please drive safely and not be distracted with cell phones and texting while driving.   Ms. Montone , Thank you for taking time to come for your Medicare Wellness Visit. I appreciate your ongoing  commitment to your health goals. Please review the following plan we discussed and let me know if I can assist you in the future.   This is a list of the screening recommended for you and due dates:  Health Maintenance  Topic Date Due   COVID-19 Vaccine (8 - 2024-25 season) 12/22/2023   Medicare Annual Wellness Visit  01/03/2025   DTaP/Tdap/Td vaccine (4 - Td or Tdap) 08/30/2030   Pneumonia Vaccine  Completed   Flu Shot  Completed   DEXA scan (bone density measurement)  Completed   Hepatitis C Screening  Completed   Zoster (Shingles) Vaccine  Completed   HPV Vaccine  Aged Out   Colon Cancer Screening  Discontinued   Cologuard (Stool DNA test)  Discontinued   We discussed possibly repeating a bone density test next year (a 10 year follow-up; the one in 2016 was normal).  If you want to schedule it early, so that you can have it done the same day as your mammogram, contact us in October or November and we can place the order so that you can arrange to schedule both for the same day in March.  (Sometimes they get booked up early when trying to arrange for them on the same day).  I encourage you to look into [employer] classes.  [facility] is the rescue we spoke of (for fostering cats).

## 2024-01-03 NOTE — Progress Notes (Unsigned)
 No chief complaint on file.  Sheryl Baker is a 78 y.o. female who presents for annual physical exam, Medicare wellness visit and follow-up on chronic problems.  Follow-up on hypertension.  She reports compliance with taking HCTZ 12.5mg  (started in 12/2019). She is  tolerating the medication without side effects. She denies headaches, dizziness, chest pain, edema or muscle cramps. BP's have been running ***  Her BP monitor had been verified as accurate in 12/2019, rechecked 07/2022.  BP Readings from Last 3 Encounters:  12/29/22 130/70  07/27/22 138/70  07/06/22 (!) 160/62    H/o elevated cholesterol in the past, improved with diet. Denies change in diet, red meat once a week or less.  Lab Results  Component Value Date   CHOL 195 12/27/2021   HDL 80 12/27/2021   LDLCALC 104 (H) 12/27/2021   TRIG 61 12/27/2021   CHOLHDL 2.4 12/27/2021   Impaired fasting glucose: Sugar was slightly elevated at 101 in 2022, <100 for the last 2 years. A1c was 6.1% last year.  Reported last year having a little more juice, and eating more rice and potatoes.     Immunization History  Administered Date(s) Administered   Influenza Split 07/01/2009, 07/04/2012, 07/05/2013   Influenza Whole 07/28/2010, 06/22/2011   Influenza, High Dose Seasonal PF 07/16/2014, 06/29/2016, 07/21/2017, 07/26/2018, 07/12/2019, 07/10/2020, 07/14/2021, 07/04/2022, 06/21/2023   PFIZER(Purple Top)SARS-COV-2 Vaccination 12/14/2019, 01/06/2020, 07/31/2020, 03/15/2021   PNEUMOCOCCAL CONJUGATE-20 06/26/2021   Pfizer Covid-19 Vaccine Bivalent Booster 23yrs & up 07/14/2021   Pfizer(Comirnaty)Fall Seasonal Vaccine 12 years and older 12/29/2022, 06/21/2023   Pneumococcal Conjugate-13 04/15/2015   Pneumococcal Polysaccharide-23 02/17/2010, 12/16/2020, 07/04/2022   Respiratory Syncytial Virus Vaccine,Recomb Aduvanted(Arexvy) 07/18/2022   Td 07/25/2005   Tdap 06/22/2011, 08/30/2020   Zoster Recombinant(Shingrix) 11/08/2018,  01/17/2019   Zoster, Live 08/26/2011   Last Pap smear: 03/2015 normal; no high risk HPV detected. No longer screened due to age. Last mammogram: 12/29/2023 Last colonoscopy: 03/2010 Dr.Edwards-mild int hemorrhoids, diverticulosis sigmoid colon, 2mm polyp proximal ascending colon, 2mm polyp rectum. Path benign (not adenomatous); negative Cologuard 12/2019 and 12/2022. Last DEXA: 04/2015, normal. Dentist: every 6 months Ophtho: yearly Exercise:    Walks 3x/week for 30-45 minutes Yardwork daily seasonally (mowing, raking, pruning, moving mulch), recently restarted. Has some weights for wrists/ankles, only sporadically. 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 Only sporadically gets to the [facility] (bike, treadmill)  Vitamin D-OH screen normal (52) in 2012  She donates blood regularly    Patient Care Team: Joselyn Arrow, MD as PCP - General (Family Medicine) Ophtho: Dr. Lorin Picket (Dr Edrick Oh did cataract surgery; also saw Dr. Clelia Croft in f/u (Dr. Edrick Oh no longer there)) Dentist: Dr. Sharma Covert  GI: Dr. Randa Evens Derm: Dr. Janine Limbo: Shon Baton, PA  Depression Screening: Flowsheet Row Office Visit from 12/29/2022 in [facility]  PHQ-2 Total Score 0        Falls screen:     12/29/2022    8:18 AM 07/06/2022    1:19 PM 12/27/2021    9:25 AM 12/16/2020    8:28 AM 12/05/2019    8:22 AM  Fall Risk   Falls in the past year? 0 0 0 0 0  Number falls in past yr: 0 0 0    Injury with Fall? 0 0 0    Risk for fall due to : No Fall Risks No Fall Risks No Fall Risks    Follow up Falls evaluation completed Falls evaluation completed Falls evaluation completed  Functional Status Survey:         End of Life Care: She has healthcare power of attorney and living will; copies are scanned in chart (07/2017)    PMH, PSH, SH and FH reviewed and updated.     ROS:  The patient denies anorexia, fever, headaches,  vision changes, decreased hearing, ear pain, sore  throat, breast concerns, chest pain, palpitations, dizziness, syncope, dyspnea on exertion, cough, swelling, nausea, vomiting, diarrhea, constipation, abdominal pain, melena, hematochezia, indigestion/heartburn, hematuria, incontinence, dysuria, vaginal bleeding/spotting, discharge, odor or itch, genital lesions, joint pains, numbness, tingling, weakness, tremor, suspicious skin lesions, depression, anxiety, abnormal bleeding/bruising, or enlarged lymph nodes. Some hand/finger pain/arthritis (sore after a lot of yardwork; denies morning stiffness). Left knee hurts with kneeling, but no recurrent swelling. No change to the pigmented area on L cheek (applying cream to help lighten it, starting to help).    PHYSICAL EXAM:  There were no vitals taken for this visit.   Wt Readings from Last 3 Encounters:  12/29/22 154 lb 6.4 oz (70 kg)  07/27/22 151 lb 12.8 oz (68.9 kg)  07/06/22 153 lb 9.6 oz (69.7 kg)   General Appearance:     Alert, cooperative, no distress, appears stated age   Head:     Normocephalic, without obvious abnormality.   Eyes:     PERRL, conjunctiva/corneas clear, EOM's intact, fundi benign  Ears:     Normal TM's and external ear canals   Nose:    No drainage or sinus tenderness   Throat:    Normal mucosa  Neck:    Supple, no lymphadenopathy;  thyroid:  no enlargement/tenderness/ nodules; no carotid bruit or JVD   Back:     Spine nontender, no curvature, ROM normal, no CVA tenderness   Lungs:      Clear to auscultation bilaterally without wheezes, rales or ronchi; respirations unlabored   Chest Wall:   Nontender, no mass   Heart:     Regular rate and rhythm, S1 and S2 normal, no murmur, rub or gallop   Breast Exam:     No tenderness, masses, or nipple discharge or inversion.  No axillary lymphadenopathy. WHSS L upper breast, smaller.  Abdomen:      Soft, non-tender, nondistended, normoactive bowel sounds,  no masses, no hepatosplenomegaly   Genitalia:     Normal external  genitalia without lesions.  Atrophic changes noted. BUS and vagina normal; no cervical motion tenderness; bimanual exam normal--uterus and adnexa not enlarged, nontender, no masses.  Pap not performed ***  Rectal:     Normal tone, no masses or tenderness; guaiac negative stool ***  Extremities:    No clubbing, cyanosis or edema. R index finger with bony deformity at DIP, with some ulnar deviation; similar bony deformity/hypertrophy at DIP of R 2nd finger and L index finger.  L pinkie also with some OA changes. No STS at knees  Pulses:    2+ and symmetric all extremities   Skin:    Skin color, texture, turgor normal, no rashes or suspicious lesions. WHSS upper back on R. Approx 12 x 12mm lightly hyperpigmented, almost heart-shaped macular area on left cheek  Lymph nodes:    Cervical, supraclavicular, inguinal and axillary nodes normal   Neurologic:    Normal strength, sensation and gait; reflexes 2+ and symmetric throughout                    Psych:   Normal mood, affect, hygiene and grooming   ***  UPDATE if no pelvic/rectal. Update fingers Update skin--macular area L cheek   Lab Results  Component Value Date   HGBA1C 6.1 (A) 12/29/2022    ASSESSMENT/PLAN:  Offer/give/decline COVID booster  Can skip pelvic exam this year if not having any symptoms or concerns.   Discussed monthly self breast exams and yearly mammograms; at least 30 minutes of aerobic activity at least 5 days/week, weight-bearing exercise at least 2 days/week; proper sunscreen use reviewed; healthy diet, including goals of calcium and vitamin D intake and alcohol recommendations (less than or equal to 1 drink/day) reviewed; regular seatbelt use; changing batteries in smoke detectors.  Immunization recommendations discussed, UTD; continue yearly high dose flu shots.  COVID booster given today. *** Colon cancer screening is UTD. Pap not needed due to age, low risk. Consider repeat DEXA next year. Reviewed recommendations for  calcium intake, vit D, and weight-bearing exercise.   MOST form updated and reviewed.   Full Code, Full care  CPE 1 year, sooner prn for concerns or higher blood pressures   Medicare Attestation I have personally reviewed: The patient's medical and social history Their use of alcohol, tobacco or illicit drugs Their current medications and supplements The patient's functional ability including ADLs,fall risks, home safety risks, cognitive, and hearing and visual impairment Diet and physical activities Evidence for depression or mood disorders  The patient's weight, height, BMI have been recorded in the chart.  I have made referrals, counseling, and provided education to the patient based on review of the above and I have provided the patient with a written personalized care plan for preventive services.

## 2024-01-04 ENCOUNTER — Ambulatory Visit: Payer: Medicare Other | Admitting: Family Medicine

## 2024-01-04 ENCOUNTER — Encounter: Payer: Self-pay | Admitting: Family Medicine

## 2024-01-04 VITALS — BP 120/70 | HR 72 | Ht 69.0 in | Wt 155.0 lb

## 2024-01-04 DIAGNOSIS — Z1159 Encounter for screening for other viral diseases: Secondary | ICD-10-CM | POA: Diagnosis not present

## 2024-01-04 DIAGNOSIS — E7800 Pure hypercholesterolemia, unspecified: Secondary | ICD-10-CM

## 2024-01-04 DIAGNOSIS — I10 Essential (primary) hypertension: Secondary | ICD-10-CM

## 2024-01-04 DIAGNOSIS — R7301 Impaired fasting glucose: Secondary | ICD-10-CM

## 2024-01-04 DIAGNOSIS — Z0000 Encounter for general adult medical examination without abnormal findings: Secondary | ICD-10-CM

## 2024-01-04 DIAGNOSIS — Z5181 Encounter for therapeutic drug level monitoring: Secondary | ICD-10-CM | POA: Diagnosis not present

## 2024-01-04 DIAGNOSIS — Z52008 Unspecified donor, other blood: Secondary | ICD-10-CM | POA: Diagnosis not present

## 2024-01-04 DIAGNOSIS — Z23 Encounter for immunization: Secondary | ICD-10-CM | POA: Diagnosis not present

## 2024-01-04 LAB — LIPID PANEL

## 2024-01-04 LAB — POCT GLYCOSYLATED HEMOGLOBIN (HGB A1C): Hemoglobin A1C: 5.5 % (ref 4.0–5.6)

## 2024-01-05 ENCOUNTER — Encounter: Payer: Self-pay | Admitting: Family Medicine

## 2024-01-05 LAB — CMP14+EGFR
ALT: 17 IU/L (ref 0–32)
AST: 30 IU/L (ref 0–40)
Albumin: 4.4 g/dL (ref 3.8–4.8)
Alkaline Phosphatase: 109 IU/L (ref 44–121)
BUN/Creatinine Ratio: 16 (ref 12–28)
BUN: 11 mg/dL (ref 8–27)
Bilirubin Total: 0.5 mg/dL (ref 0.0–1.2)
CO2: 22 mmol/L (ref 20–29)
Calcium: 9.5 mg/dL (ref 8.7–10.3)
Chloride: 102 mmol/L (ref 96–106)
Creatinine, Ser: 0.68 mg/dL (ref 0.57–1.00)
Globulin, Total: 2.7 g/dL (ref 1.5–4.5)
Glucose: 100 mg/dL — ABNORMAL HIGH (ref 70–99)
Potassium: 3.6 mmol/L (ref 3.5–5.2)
Sodium: 143 mmol/L (ref 134–144)
Total Protein: 7.1 g/dL (ref 6.0–8.5)
eGFR: 89 mL/min/{1.73_m2} (ref >59)

## 2024-01-05 LAB — CBC WITH DIFFERENTIAL/PLATELET
Basophils Absolute: 0 10*3/uL (ref 0.0–0.2)
Basos: 1 %
EOS (ABSOLUTE): 0.1 10*3/uL (ref 0.0–0.4)
Eos: 1 %
Hematocrit: 42.5 % (ref 34.0–46.6)
Hemoglobin: 13.7 g/dL (ref 11.1–15.9)
Immature Grans (Abs): 0 10*3/uL (ref 0.0–0.1)
Immature Granulocytes: 0 %
Lymphocytes Absolute: 2 10*3/uL (ref 0.7–3.1)
Lymphs: 41 %
MCH: 29 pg (ref 26.6–33.0)
MCHC: 32.2 g/dL (ref 31.5–35.7)
MCV: 90 fL (ref 79–97)
Monocytes Absolute: 0.5 10*3/uL (ref 0.1–0.9)
Monocytes: 10 %
Neutrophils Absolute: 2.3 10*3/uL (ref 1.4–7.0)
Neutrophils: 47 %
Platelets: 254 10*3/uL (ref 150–450)
RBC: 4.73 x10E6/uL (ref 3.77–5.28)
RDW: 16.3 % — ABNORMAL HIGH (ref 11.7–15.4)
WBC: 4.9 10*3/uL (ref 3.4–10.8)

## 2024-01-05 LAB — LIPID PANEL
Cholesterol, Total: 206 mg/dL — ABNORMAL HIGH (ref 100–199)
HDL: 76 mg/dL (ref >39)
LDL CALC COMMENT:: 2.7 ratio (ref 0.0–4.4)
LDL Chol Calc (NIH): 116 mg/dL — ABNORMAL HIGH (ref 0–99)
Triglycerides: 77 mg/dL (ref 0–149)
VLDL Cholesterol Cal: 14 mg/dL (ref 5–40)

## 2024-01-05 LAB — RUBEOLA ANTIBODY IGG

## 2024-01-30 DIAGNOSIS — H00022 Hordeolum internum right lower eyelid: Secondary | ICD-10-CM | POA: Diagnosis not present

## 2024-02-28 DIAGNOSIS — H40053 Ocular hypertension, bilateral: Secondary | ICD-10-CM | POA: Diagnosis not present

## 2024-02-28 DIAGNOSIS — H524 Presbyopia: Secondary | ICD-10-CM | POA: Diagnosis not present

## 2024-05-10 ENCOUNTER — Encounter: Payer: Self-pay | Admitting: Advanced Practice Midwife

## 2024-06-25 DIAGNOSIS — Z23 Encounter for immunization: Secondary | ICD-10-CM | POA: Diagnosis not present

## 2024-07-13 ENCOUNTER — Other Ambulatory Visit: Payer: Self-pay | Admitting: Family Medicine

## 2024-07-13 DIAGNOSIS — I10 Essential (primary) hypertension: Secondary | ICD-10-CM

## 2024-10-19 ENCOUNTER — Other Ambulatory Visit: Payer: Self-pay | Admitting: Family Medicine

## 2024-10-19 DIAGNOSIS — I10 Essential (primary) hypertension: Secondary | ICD-10-CM

## 2024-11-15 ENCOUNTER — Other Ambulatory Visit: Payer: Self-pay | Admitting: Family Medicine

## 2024-11-15 DIAGNOSIS — Z1231 Encounter for screening mammogram for malignant neoplasm of breast: Secondary | ICD-10-CM

## 2024-12-30 ENCOUNTER — Ambulatory Visit

## 2025-01-13 ENCOUNTER — Ambulatory Visit: Payer: Self-pay | Admitting: Family Medicine
